# Patient Record
Sex: Female | Born: 1995 | Race: White | Hispanic: No | Marital: Single | Smoking: Never smoker
Health system: Southern US, Community
[De-identification: ages and names within clinical notes are randomized; demographics above are authoritative.]

## PROBLEM LIST (undated history)

## (undated) ENCOUNTER — Ambulatory Visit (HOSPITAL_COMMUNITY): Discharge status: Absent without leave | Payer: Commercial Managed Care - PPO

## (undated) DIAGNOSIS — R569 Unspecified convulsions: Secondary | ICD-10-CM

## (undated) DIAGNOSIS — R0600 Dyspnea, unspecified: Secondary | ICD-10-CM

## (undated) DIAGNOSIS — F32A Depression, unspecified: Secondary | ICD-10-CM

## (undated) DIAGNOSIS — N200 Calculus of kidney: Secondary | ICD-10-CM

## (undated) DIAGNOSIS — F419 Anxiety disorder, unspecified: Secondary | ICD-10-CM

## (undated) DIAGNOSIS — Z87442 Personal history of urinary calculi: Secondary | ICD-10-CM

## (undated) DIAGNOSIS — B999 Unspecified infectious disease: Secondary | ICD-10-CM

## (undated) DIAGNOSIS — F429 Obsessive-compulsive disorder, unspecified: Secondary | ICD-10-CM

## (undated) HISTORY — DX: Calculus of kidney: N20.0

## (undated) HISTORY — PX: WISDOM TOOTH EXTRACTION: SHX21

---
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
## 2014-11-26 DIAGNOSIS — F429 Obsessive-compulsive disorder, unspecified: Secondary | ICD-10-CM

## 2014-11-26 HISTORY — DX: Obsessive-compulsive disorder, unspecified: F42.9

## 2015-01-05 ENCOUNTER — Encounter: Payer: Self-pay | Admitting: Family Medicine

## 2015-01-05 ENCOUNTER — Ambulatory Visit (INDEPENDENT_AMBULATORY_CARE_PROVIDER_SITE_OTHER): Payer: Commercial Managed Care - PPO | Admitting: Family Medicine

## 2015-01-05 ENCOUNTER — Ambulatory Visit: Payer: Commercial Managed Care - PPO | Admitting: Internal Medicine

## 2015-01-05 VITALS — BP 98/60 | HR 71 | Temp 98.2°F | Wt 115.8 lb

## 2015-01-05 DIAGNOSIS — L989 Disorder of the skin and subcutaneous tissue, unspecified: Secondary | ICD-10-CM | POA: Insufficient documentation

## 2015-01-05 MED ORDER — DOXYCYCLINE HYCLATE 100 MG PO TABS: 100.0000 mg | ORAL_TABLET | Freq: Two times a day (BID) | ORAL | Status: DC

## 2015-01-05 NOTE — Patient Instructions (Signed)
Good to see you. Take doxycyline as directed- 1 tablet twice daily x 10 days. Apply warm compresses. Please call us with an update if it does not resolve as expected.

## 2015-01-05 NOTE — Assessment & Plan Note (Signed)
New- non fluctuant. Will treat with oral abx given duration and progression of symptoms. Doxycyline 100 mg twice daily x 10 days, warm compresses. Call or return to clinic prn if these symptoms worsen or fail to improve as anticipated. The patient indicates understanding of these issues and agrees with the plan.

## 2015-01-05 NOTE — Progress Notes (Signed)
Pre visit review using our clinic review tool, if applicable. No additional management support is needed unless otherwise documented below in the visit note. 

## 2015-01-05 NOTE — Progress Notes (Signed)
   Subjective:   Patient ID: Emily Lowe, female    DOB: [birth year], 19 y.o.   MRN: [id number]  Emily Lowe is a pleasant 19 y.o. year old female who presents to clinic today with Mass  on 01/05/2015  HPI:  Raised lesion under her left eye for over a week.  Becoming more red and firm. Not tender unless you touch it. No fevers or chills. Never had anything like this in past.  No known drug allergies.  No current outpatient prescriptions on file prior to visit.   No current facility-administered medications on file prior to visit.    No Known Allergies  Past Medical History  Diagnosis Date  . Kidney stones     followed by Dr. Achilles Dunkope    No past surgical history on file.  Family History  Problem Relation Age of Onset  . Hypertension Mother   . Gout Father     History   Social History  . Marital Status: Single    Spouse Name: N/A  . Number of Children: N/A  . Years of Education: N/A   Occupational History  . Not on file.   Social History Main Topics  . Smoking status: Never Smoker   . Smokeless tobacco: Not on file  . Alcohol Use: Not on file  . Drug Use: Not on file  . Sexual Activity: Not on file   Other Topics Concern  . Not on file   Social History Narrative   Graduated from high school in 2014.  Going to [employer] in fall.  Physics major.  Thinking about engineering.   The PMH, PSH, Social History, Family History, Medications, and allergies have been [facility], and have been updated if relevant.   Review of Systems  Constitutional: Negative.   Gastrointestinal: Negative.   All other systems reviewed and are negative.      Objective:    BP 98/60 mmHg  Pulse 71  Temp(Src) 98.2 F (36.8 C) (Oral)  Wt 115 lb 12 oz (52.504 kg)  SpO2 98%  LMP 12/31/2014   Physical Exam  Constitutional: She appears well-developed and well-nourished. No distress.  HENT:  Head: Normocephalic.  Eyes: Conjunctivae are normal.  Cardiovascular: Normal  rate.   Pulmonary/Chest: Effort normal.  Skin:     Psychiatric: She has a normal mood and affect. Her behavior is normal. Judgment and thought content normal.  Nursing note and vitals reviewed.         Assessment & Plan:   Skin lesion No Follow-up on file.

## 2015-01-07 ENCOUNTER — Ambulatory Visit: Payer: Commercial Managed Care - PPO | Admitting: Internal Medicine

## 2015-02-08 ENCOUNTER — Encounter: Payer: Self-pay | Admitting: Family Medicine

## 2015-02-08 ENCOUNTER — Ambulatory Visit (INDEPENDENT_AMBULATORY_CARE_PROVIDER_SITE_OTHER): Payer: Commercial Managed Care - PPO | Admitting: Family Medicine

## 2015-02-08 VITALS — BP 94/60 | HR 76 | Temp 98.7°F | Wt 116.2 lb

## 2015-02-08 DIAGNOSIS — L989 Disorder of the skin and subcutaneous tissue, unspecified: Secondary | ICD-10-CM

## 2015-02-08 DIAGNOSIS — F4322 Adjustment disorder with anxiety: Secondary | ICD-10-CM | POA: Insufficient documentation

## 2015-02-08 MED ORDER — SULFAMETHOXAZOLE-TRIMETHOPRIM 800-160 MG PO TABS: 1.0000 | ORAL_TABLET | Freq: Two times a day (BID) | ORAL | Status: DC

## 2015-02-08 NOTE — Assessment & Plan Note (Signed)
Deteriorated. Discussed tx options, including SSRIs- she wants to think about it and get back to me.

## 2015-02-08 NOTE — Progress Notes (Signed)
Pre visit review using our clinic review tool, if applicable. No additional management support is needed unless otherwise documented below in the visit note. 

## 2015-02-08 NOTE — Progress Notes (Signed)
   Subjective:   Patient ID: Emily Lowe, female    DOB: Mar 06, 1996, 19 y.o.   MRN: [id number]  Emily Lowe is a pleasant 19 y.o. year old female who presents to clinic today with Rash  on 02/08/2015  HPI:  Saw her on 2/10 for raised lesion under her left eye for over a week that was becoming progressively red and firm.   Never had anything like this in past.  ?infected cyst- given doxycyline 100 mg twice daily x 10 days, warm compresses and advised to follow up as needed. Resolved almost completely but recently it has returned in same spot.  No longer raised but about the same size, hyperpigmented. Non tender.  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 Denies feeling depressed.  No current outpatient prescriptions on file prior to visit.   No current facility-administered medications on file prior to visit.    No Known Allergies  Past Medical History  Diagnosis Date  . Kidney stones     followed by Dr. Achilles Dunkope    History reviewed. No pertinent past surgical history.  Family History  Problem Relation Age of Onset  . Hypertension Mother   . Gout Father     History   Social History  . Marital Status: Single    Spouse Name: N/A  . Number of Children: N/A  . Years of Education: N/A   Occupational History  . Not on file.   Social History Main Topics  . Smoking status: Never Smoker   . Smokeless tobacco: Not on file  . Alcohol Use: Not on file  . Drug Use: Not on file  . Sexual Activity: Not on file   Other Topics Concern  . Not on file   Social History Narrative   Graduated from high school in 2014.  Going to [employer] in fall.  Physics major.  Thinking about engineering.   The PMH, PSH, Social History, Family History, Medications, and allergies [facility], and have been updated if relevant.   Review of Systems  Constitutional: Negative.     Respiratory: Negative.   Cardiovascular: Negative.   Gastrointestinal: Negative.   Psychiatric/Behavioral: Negative for suicidal ideas, hallucinations, behavioral problems, confusion, sleep disturbance, self-injury, dysphoric mood and decreased concentration. The patient is nervous/anxious. The patient is not hyperactive.   All other systems reviewed and are negative.      Objective:    BP 94/60 mmHg  Pulse 76  Temp(Src) 98.7 F (37.1 C) (Oral)  Wt 116 lb 4 oz (52.731 kg)  SpO2 97%  LMP 02/03/2015   Physical Exam  Constitutional: She is oriented to person, place, and time. She appears well-developed and well-nourished. No distress.  HENT:  Head: Normocephalic.  Eyes: Conjunctivae are normal.  Cardiovascular: Normal rate.   Pulmonary/Chest: Effort normal.  Neurological: She is alert and oriented to person, place, and time.  Skin:     Psychiatric: She has a normal mood and affect. Her behavior is normal. Judgment and thought content normal.  Nursing note and vitals reviewed.         Assessment & Plan:   Skin lesion No Follow-up on file.

## 2015-02-08 NOTE — Patient Instructions (Signed)
Good to see you. Take Bactrim as directed- 1 tablet twice daily for 10 days.  We will call you with a dermatology referral.  Think about whether or not you want to try something like zoloft or lexapro to help with your anxiety.

## 2015-02-08 NOTE — Assessment & Plan Note (Signed)
Did improved with abx - ? Staph infection. Will treat with Bactrim DS- 1 tablet twice daily for 10 days. Also refer to derm for removal/biopsy- unclear if infectious or vascular/skin lesion. The patient indicates understanding of these issues and agrees with the plan.

## 2015-11-10 ENCOUNTER — Encounter: Payer: Self-pay | Admitting: Family Medicine

## 2015-11-10 ENCOUNTER — Ambulatory Visit (INDEPENDENT_AMBULATORY_CARE_PROVIDER_SITE_OTHER): Payer: Commercial Managed Care - PPO | Admitting: Family Medicine

## 2015-11-10 VITALS — BP 118/70 | HR 73 | Temp 97.9°F | Wt 112.5 lb

## 2015-11-10 DIAGNOSIS — F429 Obsessive-compulsive disorder, unspecified: Secondary | ICD-10-CM

## 2015-11-10 DIAGNOSIS — F4322 Adjustment disorder with anxiety: Secondary | ICD-10-CM

## 2015-11-10 MED ORDER — SERTRALINE HCL 25 MG PO TABS: 25.0000 mg | ORAL_TABLET | Freq: Every day | ORAL | Status: DC

## 2015-11-10 NOTE — Progress Notes (Signed)
   Subjective:   Patient ID: Emily Lowe, female    DOB: 07-27-1996, 19 y.o.   MRN: [id number]  Emily Lowe is a pleasant 19 y.o. year old female who presents to clinic today with Anxiety and obsessive compulsive disorder  on 11/10/2015  HPI:  She is concerned that she may have OCD.  Since she was in elementary school, she has had intermittent compulsive and obcessive behaviors, usually concerning hand washing. "I am a germa phobe."  Symptoms seemed to resolve on their own but have been worse recently- she just finished her fall semester at college.  Washes her hands so much they sometimes bleed.  Has never sought out treatment for this.  No current outpatient prescriptions on file prior to visit.   No current facility-administered medications on file prior to visit.    No Known Allergies  Past Medical History  Diagnosis Date  . Kidney stones     followed by Dr. Achilles Dunkope    No past surgical history on file.  Family History  Problem Relation Age of Onset  . Hypertension Mother   . Gout Father     Social History   Social History  . Marital Status: Single    Spouse Name: N/A  . Number of Children: N/A  . Years of Education: N/A   Occupational History  . Not on file.   Social History Main Topics  . Smoking status: Never Smoker   . Smokeless tobacco: Not on file  . Alcohol Use: Not on file  . Drug Use: Not on file  . Sexual Activity: Not on file   Other Topics Concern  . Not on file   Social History Narrative   Graduated from high school in 2014.  Going to [employer] in fall.  Physics major.  Thinking about engineering.   The PMH, PSH, Social History, Family History, Medications, and allergies have been [facility], and have been updated if relevant.   Review of Systems  Psychiatric/Behavioral: Negative for suicidal ideas, hallucinations, behavioral problems, confusion, sleep disturbance, self-injury, dysphoric mood, decreased concentration and  agitation. The patient is nervous/anxious. The patient is not hyperactive.   All other systems reviewed and are negative.      Objective:    BP 118/70 mmHg  Pulse 73  Temp(Src) 97.9 F (36.6 C) (Oral)  Wt 112 lb 8 oz (51.03 kg)  SpO2 99%  LMP 11/01/2015   Physical Exam  Constitutional: She is oriented to person, place, and time. She appears well-developed and well-nourished. No distress.  HENT:  Head: Normocephalic.  Eyes: Conjunctivae are normal.  Cardiovascular: Normal rate.   Pulmonary/Chest: Effort normal.  Musculoskeletal: Normal range of motion.  Neurological: She is alert and oriented to person, place, and time. No cranial nerve deficit.  Skin: Skin is warm and dry. She is not diaphoretic.  Psychiatric:  Tearful, good eye contact and appropriate  Nursing note and vitals reviewed.         Assessment & Plan:   OCD (obsessive compulsive disorder)  Adjustment disorder with anxiety No Follow-up on file.

## 2015-11-10 NOTE — Assessment & Plan Note (Signed)
>  25 minutes spent in face to face time with patient, >50% spent in counselling or coordination of care Discussed tx options- she declines psychotherapy but agrees to starting rx- eRx sent for zoloft 25 mg daily.  She will update me in 3-4 weeks.

## 2015-11-10 NOTE — Progress Notes (Signed)
Pre visit review using our clinic review tool, if applicable. No additional management support is needed unless otherwise documented below in the visit note. 

## 2015-11-10 NOTE — Patient Instructions (Signed)
Great to see you. We are starting zoloft 25 mg daily. Please call me with an update in a few weeks.  Happy Holidays.

## 2016-01-19 ENCOUNTER — Other Ambulatory Visit: Payer: Self-pay | Admitting: Family Medicine

## 2016-01-24 ENCOUNTER — Emergency Department (HOSPITAL_COMMUNITY)
Admission: EM | Admit: 2016-01-24 | Discharge: 2016-01-24 | Discharge status: Against Medical Advice | Payer: Commercial Managed Care - PPO | Attending: Emergency Medicine | Admitting: Emergency Medicine

## 2016-01-24 ENCOUNTER — Emergency Department (HOSPITAL_COMMUNITY): Payer: Commercial Managed Care - PPO

## 2016-01-24 ENCOUNTER — Encounter (HOSPITAL_COMMUNITY): Payer: Self-pay | Admitting: Emergency Medicine

## 2016-01-24 DIAGNOSIS — F429 Obsessive-compulsive disorder, unspecified: Secondary | ICD-10-CM | POA: Insufficient documentation

## 2016-01-24 DIAGNOSIS — Z87442 Personal history of urinary calculi: Secondary | ICD-10-CM | POA: Diagnosis not present

## 2016-01-24 DIAGNOSIS — F419 Anxiety disorder, unspecified: Secondary | ICD-10-CM | POA: Insufficient documentation

## 2016-01-24 DIAGNOSIS — R1032 Left lower quadrant pain: Secondary | ICD-10-CM | POA: Insufficient documentation

## 2016-01-24 DIAGNOSIS — Z79899 Other long term (current) drug therapy: Secondary | ICD-10-CM | POA: Insufficient documentation

## 2016-01-24 DIAGNOSIS — Z3202 Encounter for pregnancy test, result negative: Secondary | ICD-10-CM | POA: Diagnosis not present

## 2016-01-24 HISTORY — DX: Anxiety disorder, unspecified: F41.9

## 2016-01-24 HISTORY — DX: Obsessive-compulsive disorder, unspecified: F42.9

## 2016-01-24 LAB — COMPREHENSIVE METABOLIC PANEL
ALK PHOS: 38 U/L (ref 38–126)
ALT: 14 U/L (ref 14–54)
ANION GAP: 9 (ref 5–15)
AST: 19 U/L (ref 15–41)
Albumin: 4.5 g/dL (ref 3.5–5.0)
BILIRUBIN TOTAL: 0.7 mg/dL (ref 0.3–1.2)
BUN: 7 mg/dL (ref 6–20)
CALCIUM: 9.4 mg/dL (ref 8.9–10.3)
CO2: 24 mmol/L (ref 22–32)
Chloride: 108 mmol/L (ref 101–111)
Creatinine, Ser: 0.73 mg/dL (ref 0.44–1.00)
Glucose, Bld: 89 mg/dL (ref 65–99)
Potassium: 3.6 mmol/L (ref 3.5–5.1)
SODIUM: 141 mmol/L (ref 135–145)
TOTAL PROTEIN: 7.3 g/dL (ref 6.5–8.1)

## 2016-01-24 LAB — URINALYSIS, ROUTINE W REFLEX MICROSCOPIC
Bilirubin Urine: NEGATIVE
Glucose, UA: NEGATIVE mg/dL
Ketones, ur: 15 mg/dL — AB
NITRITE: NEGATIVE
PROTEIN: NEGATIVE mg/dL
Specific Gravity, Urine: 1.021 (ref 1.005–1.030)
pH: 5.5 (ref 5.0–8.0)

## 2016-01-24 LAB — CBC
HCT: 38.4 % (ref 36.0–46.0)
HEMOGLOBIN: 12.6 g/dL (ref 12.0–15.0)
MCH: 29.6 pg (ref 26.0–34.0)
MCHC: 32.8 g/dL (ref 30.0–36.0)
MCV: 90.1 fL (ref 78.0–100.0)
Platelets: 216 10*3/uL (ref 150–400)
RBC: 4.26 MIL/uL (ref 3.87–5.11)
RDW: 12.6 % (ref 11.5–15.5)
WBC: 11 10*3/uL — AB (ref 4.0–10.5)

## 2016-01-24 LAB — PREGNANCY, URINE: Preg Test, Ur: NEGATIVE

## 2016-01-24 LAB — I-STAT TROPONIN, ED: TROPONIN I, POC: 0 ng/mL (ref 0.00–0.08)

## 2016-01-24 LAB — URINE MICROSCOPIC-ADD ON

## 2016-01-24 LAB — LIPASE, BLOOD: Lipase: 21 U/L (ref 11–51)

## 2016-01-24 NOTE — ED Provider Notes (Signed)
CSN: [id number]     Arrival date & time 01/24/16  1430 History   First MD Initiated Contact with Patient 01/24/16 1810     Chief Complaint  Patient presents with  . Abdominal Pain  . Flank Pain     (Consider location/radiation/quality/duration/timing/severity/associated sxs/prior Treatment) HPI Comments: Patient presents to the ED with a chief complaint of LLQ abdominal pain. States that the pain started suddenly at 2:00PM today.  She states that she has a history of KS, but is not convinced that this feels like one.  She states that she also just started her period.  She denies any associate dysuria.  Denies any vaginal discharge.  There are no modifying factors.  Has never required intervention for KS.  Pain significantly improved after arriving at ED.  The history is provided by the patient. No language interpreter was used.    Past Medical History  Diagnosis Date  . Kidney stones     followed by Dr. Achilles Dunk  . OCD (obsessive compulsive disorder)   . Anxiety    History reviewed. No pertinent past surgical history. Family History  Problem Relation Age of Onset  . Hypertension Mother   . Gout Father    Social History  Substance Use Topics  . Smoking status: Never Smoker   . Smokeless tobacco: None  . Alcohol Use: None   OB History    No data available     Review of Systems  All other systems reviewed and are negative.     Allergies  Review of patient's allergies indicates no known allergies.  Home Medications   Prior to Admission medications   Medication Sig Start Date End Date Taking? Authorizing Provider  ibuprofen (ADVIL,MOTRIN) 200 MG tablet Take 200 mg by mouth every 6 (six) hours as needed for headache.   Yes Historical Provider, MD  oxyCODONE-acetaminophen (PERCOCET/ROXICET) 5-325 MG tablet Take 1 tablet by mouth every 6 (six) hours as needed for severe pain.   Yes Historical Provider, MD  sertraline (ZOLOFT) 25 MG tablet take 1 tablet by mouth once daily  01/19/16  Yes Dianne Dun, MD   BP 116/80 mmHg  Pulse 97  Temp(Src) 97.9 F (36.6 C) (Oral)  Resp 16  SpO2 100% Physical Exam  Constitutional: She is oriented to person, place, and time. She appears well-developed and well-nourished.  HENT:  Head: Normocephalic and atraumatic.  Eyes: Conjunctivae and EOM are normal. Pupils are equal, round, and reactive to light.  Neck: Normal range of motion. Neck supple.  Cardiovascular: Normal rate and regular rhythm.  Exam reveals no gallop and no friction rub.   No murmur heard. Pulmonary/Chest: Effort normal and breath sounds normal. No respiratory distress. She has no wheezes. She has no rales. She exhibits no tenderness.  Abdominal: Soft. Bowel sounds are normal. She exhibits no distension and no mass. There is no tenderness. There is no rebound and no guarding.  No focal abdominal tenderness, no RLQ tenderness or pain at McBurney's point, no RUQ tenderness or Murphy's sign, no left-sided abdominal tenderness, no fluid wave, or signs of peritonitis  No CVA tenderness  Musculoskeletal: Normal range of motion. She exhibits no edema or tenderness.  Neurological: She is alert and oriented to person, place, and time.  Skin: Skin is warm and dry.  Psychiatric: She has a normal mood and affect. Her behavior is normal. Judgment and thought content normal.  Nursing note and vitals reviewed.   ED Course  Procedures (including critical care time) Results for  orders placed or performed during the hospital encounter of 01/24/16  Lipase, blood  Result Value Ref Range   Lipase 21 11 - 51 U/L  Comprehensive metabolic panel  Result Value Ref Range   Sodium 141 135 - 145 mmol/L   Potassium 3.6 3.5 - 5.1 mmol/L   Chloride 108 101 - 111 mmol/L   CO2 24 22 - 32 mmol/L   Glucose, Bld 89 65 - 99 mg/dL   BUN 7 6 - 20 mg/dL   Creatinine, Ser 1.61 0.44 - 1.00 mg/dL   Calcium 9.4 8.9 - 09.6 mg/dL   Total Protein 7.3 6.5 - 8.1 g/dL   Albumin 4.5 3.5 - 5.0  g/dL   AST 19 15 - 41 U/L   ALT 14 14 - 54 U/L   Alkaline Phosphatase 38 38 - 126 U/L   Total Bilirubin 0.7 0.3 - 1.2 mg/dL   GFR calc non Af Amer >60 >60 mL/min   GFR calc Af Amer >60 >60 mL/min   Anion gap 9 5 - 15  CBC  Result Value Ref Range   WBC 11.0 (H) 4.0 - 10.5 K/uL   RBC 4.26 3.87 - 5.11 MIL/uL   Hemoglobin 12.6 12.0 - 15.0 g/dL   HCT 04.5 40.9 - 81.1 %   MCV 90.1 78.0 - 100.0 fL   MCH 29.6 26.0 - 34.0 pg   MCHC 32.8 30.0 - 36.0 g/dL   RDW 91.4 78.2 - 95.6 %   Platelets 216 150 - 400 K/uL  Urinalysis, Routine w reflex microscopic (not at [facility])  Result Value Ref Range   Color, Urine YELLOW YELLOW   APPearance CLOUDY (A) CLEAR   Specific Gravity, Urine 1.021 1.005 - 1.030   pH 5.5 5.0 - 8.0   Glucose, UA NEGATIVE NEGATIVE mg/dL   Hgb urine dipstick MODERATE (A) NEGATIVE   Bilirubin Urine NEGATIVE NEGATIVE   Ketones, ur 15 (A) NEGATIVE mg/dL   Protein, ur NEGATIVE NEGATIVE mg/dL   Nitrite NEGATIVE NEGATIVE   Leukocytes, UA TRACE (A) NEGATIVE  Pregnancy, urine  Result Value Ref Range   Preg Test, Ur NEGATIVE NEGATIVE  Urine microscopic-add on  Result Value Ref Range   Squamous Epithelial / LPF 6-30 (A) NONE SEEN   WBC, UA 6-30 0 - 5 WBC/hpf   RBC / HPF 6-30 0 - 5 RBC/hpf   Bacteria, UA RARE (A) NONE SEEN   Urine-Other MUCOUS PRESENT   I-stat troponin, ED  Result Value Ref Range   Troponin i, poc 0.00 0.00 - 0.08 ng/mL   Comment 3           Ct Renal Stone Study  01/24/2016  CLINICAL DATA:  Severe left lower abdominal pain, history of nephrolithiasis EXAM: CT ABDOMEN AND PELVIS WITHOUT CONTRAST TECHNIQUE: Multidetector CT imaging of the abdomen and pelvis was performed following the standard protocol without IV contrast. COMPARISON:  None available FINDINGS: Lower chest:  No acute findings. Hepatobiliary: No mass visualized on this un-enhanced exam. Pancreas: No mass or inflammatory process identified on this un-enhanced exam. Spleen: Within normal limits in size.  Adrenals/Urinary Tract: Normal adrenal glands. Kidneys demonstrate scattered punctate tiny intrarenal calculi bilaterally measuring 5 mm or less in size. Both kidneys demonstrate hyper attenuation of the medullary pyramids suspicious for minor medullary nephrocalcinosis. No acute obstructing urinary tract or ureteral calculus. No hydronephrosis, obstructive uropathy or perinephric inflammatory process on either side. Urinary bladder collapsed. Stomach/Bowel: Negative bowel obstruction, significant dilatation, ileus, or free air. Normal appendix in the  right lower quadrant. Vascular/Lymphatic: No pathologically enlarged lymph nodes. No evidence of abdominal aortic aneurysm. Reproductive: Uterus and adnexal normal in size. Small amount of pelvic free fluid. Other: No inguinal abnormality or hernia.  Intact abdominal wall. Musculoskeletal:  No acute osseous finding. IMPRESSION: No acute hydronephrosis, obstructive uropathy, or obstructing ureteral calculus on either side. Punctate nonobstructing intrarenal calculi bilaterally all measuring 5 mm or less in size. Hyper attenuation of the medullary pyramids bilaterally compatible with minor medullary nephrocalcinosis. Normal appendix No acute intra-abdominal or pelvic finding by noncontrast CT. Electronically Signed   By: Judie Petit.  Shick M.D.   On: 01/24/2016 19:58    I have personally reviewed and evaluated these images and lab results as part of my medical decision-making.   EKG Interpretation None      MDM   Final diagnoses:  Left lower quadrant pain    Patient with left-sided abdominal pain. Has a history of kidney stones. States that this feels similar. She is also starting her menstrual cycle.mild leukocytosis to 11, normal electrolytes and lipase.urine pregnancy test negative. No evidence of UTI.  Advised the patient have a pelvic exam and consider ultrasound for ovarian etiology. Patient declines, numbness or CT scan to rule out kidney stone.  CT  scan is negative for acute process. Once again, recommended pelvic exam.  Encouraged patient that female provider could do the pelvic.  Patient refused.   Patients that they would like to leave.  I have discussed my concerns with the patient about them leaving without completing the evaluation.    Patient presents with LLQ pain  Symptoms include: pain  Concern for: ovarian etiology of pain, worst case torsion  Study limitations and other tests offered include: Declined pelvic exam  Treatment and recommended follow-up include: Recommended pelvic exam and pelvic US.  I do not feel that the patient should leave prior to completing their workup. I have discussed the above symptoms, initial findings, study limitations, and treatment plan with the patient. Patient is not altered, does not have any distracting issues, and has capacity to make decisions for themselves. Patient places themselves at risk of torsion, worsening symptoms, disability, morbidity and/or death.    Roxy Horseman, PA-C 01/24/16 0981  Arby Barrette, MD 01/25/16 Ernestina Columbia

## 2016-01-24 NOTE — ED Notes (Addendum)
Per EMS, was walking and began having severe left lower abdominal pain. Hx of kidney stones, passed one last week and was given percocet to take, couldn't get to her dorm room to take it d/t pain. Called EMS because she couldn't walk d/t pain. Also on her menstrual cycle.  Pt states she's unsure if it's a kidney stone or if she's having menstrual cramps. Bilateral lower quadrant abdominal pain, denies blood in urine.

## 2016-01-24 NOTE — ED Notes (Signed)
Patient transported to CT 

## 2016-01-24 NOTE — Discharge Instructions (Signed)

## 2016-01-24 NOTE — ED Notes (Signed)
PA at bedside.

## 2016-01-24 NOTE — ED Notes (Signed)
Discharge instructions and follow up care reviewed with patient. Patient verbalized understanding. Patient also states that she understands that she is leaving AMA and the risks associated with that.

## 2016-01-27 ENCOUNTER — Ambulatory Visit (INDEPENDENT_AMBULATORY_CARE_PROVIDER_SITE_OTHER): Payer: Commercial Managed Care - PPO | Admitting: Internal Medicine

## 2016-01-27 ENCOUNTER — Encounter: Payer: Self-pay | Admitting: Internal Medicine

## 2016-01-27 VITALS — BP 106/60 | HR 64 | Temp 98.6°F | Wt 106.0 lb

## 2016-01-27 DIAGNOSIS — N946 Dysmenorrhea, unspecified: Secondary | ICD-10-CM

## 2016-01-27 DIAGNOSIS — R102 Pelvic and perineal pain: Secondary | ICD-10-CM | POA: Diagnosis not present

## 2016-01-27 NOTE — Progress Notes (Signed)
Pre visit review using our clinic review tool, if applicable. No additional management support is needed unless otherwise documented below in the visit note. 

## 2016-01-27 NOTE — Patient Instructions (Signed)

## 2016-01-27 NOTE — Progress Notes (Signed)
Subjective:    Patient ID: Emily HolsterAlyssa K Lowe, female    DOB: August 08, 1996, 19 y.o.   MRN: [id number]  HPI  Pt presents to the clinic today for ER followup. She called EMS 3 days ago for severe pelvic pain, that was so bad she was unable to walk. At the ER, they did a CT renal stone study, which was negative. She does have a history of kidney stones. They did a urine HCG to r/o etopic pregnancy, which was negative. The wanted to rule out ovarian torsion but she refused pelvic exam/ultrasound. She ended up leaving the ER AMA. She reports the pain has since resolved. She did start her period the same day she went to the ER, so she assumes that the pain was severe menstrual cramps. She does have regular periods, with light bleeding. Sometimes the cramps are bad and other times they are not. She reports she is not sexually active.  Review of Systems      Past Medical History  Diagnosis Date  . Kidney stones     followed by Dr. Achilles Dunkope  . OCD (obsessive compulsive disorder)   . Anxiety     Current Outpatient Prescriptions  Medication Sig Dispense Refill  . ibuprofen (ADVIL,MOTRIN) 200 MG tablet Take 200 mg by mouth every 6 (six) hours as needed for headache.    . oxyCODONE-acetaminophen (PERCOCET/ROXICET) 5-325 MG tablet Take 1 tablet by mouth every 6 (six) hours as needed for severe pain.    Marland Kitchen. sertraline (ZOLOFT) 25 MG tablet take 1 tablet by mouth once daily 30 tablet 5   No current facility-administered medications for this visit.    No Known Allergies  Family History  Problem Relation Age of Onset  . Hypertension Mother   . Gout Father     Social History   Social History  . Marital Status: Single    Spouse Name: N/A  . Number of Children: N/A  . Years of Education: N/A   Occupational History  . Not on file.   Social History Main Topics  . Smoking status: Never Smoker   . Smokeless tobacco: Not on file  . Alcohol Use: Not on file  . Drug Use: Not on file  . Sexual  Activity: Not on file   Other Topics Concern  . Not on file   Social History Narrative   Graduated from high school in 2014.  Going to [employer] in fall.  Physics major.  Thinking about engineering.     Constitutional: Denies fever, malaise, fatigue, headache or abrupt weight changes.  Respiratory: Denies difficulty breathing, shortness of breath, cough or sputum production.   Cardiovascular: Denies chest pain, chest tightness, palpitations or swelling in the hands or feet.  Gastrointestinal: Denies abdominal pain, bloating, constipation, diarrhea or blood in the stool.  GU: Pt reports vaginal bleeding. Denies urgency, frequency, pain with urination, burning sensation, blood in urine, odor or discharge.  No other specific complaints in a complete review of systems (except as listed in HPI above).  Objective:   Physical Exam  BP 106/60 mmHg  Pulse 64  Temp(Src) 98.6 F (37 C) (Oral)  Wt 106 lb (48.081 kg)  SpO2 99%  LMP 01/24/2016 Wt Readings from Last 3 Encounters:  01/27/16 106 lb (48.081 kg)  11/10/15 112 lb 8 oz (51.03 kg)  02/08/15 116 lb 4 oz (52.731 kg) (26 %*, Z = -0.64)   * Growth percentiles are based on CDC 2-20 Years data.    General: Appears  her stated age, well developed, well nourished in NAD. Cardiovascular: Normal rate and rhythm. S1,S2 noted.  No murmur, rubs or gallops noted.  Pulmonary/Chest: Normal effort and positive vesicular breath sounds. No respiratory distress. No wheezes, rales or ronchi noted.  Abdomen: Soft and nontender. Normal bowel sounds. Pelvic: She refuses.  BMET    Component Value Date/Time   NA 141 01/24/2016 1518   NA 139 04/14/2012 1830   K 3.6 01/24/2016 1518   K 3.6 04/14/2012 1830   CL 108 01/24/2016 1518   CL 104 04/14/2012 1830   CO2 24 01/24/2016 1518   CO2 27* 04/14/2012 1830   GLUCOSE 89 01/24/2016 1518   GLUCOSE 99 04/14/2012 1830   BUN 7 01/24/2016 1518   BUN 10 04/14/2012 1830   CREATININE 0.73 01/24/2016 1518    CREATININE 0.75 04/14/2012 1830   CALCIUM 9.4 01/24/2016 1518   CALCIUM 9.0 04/14/2012 1830   GFRNONAA >60 01/24/2016 1518   GFRAA >60 01/24/2016 1518    Lipid Panel     Component Value Date/Time   CHOL 171 06/01/2013 1447   TRIG 89.0 06/01/2013 1447   HDL 51.80 06/01/2013 1447   CHOLHDL 3 06/01/2013 1447   VLDL 17.8 06/01/2013 1447   LDLCALC 101* 06/01/2013 1447    CBC    Component Value Date/Time   WBC 11.0* 01/24/2016 1518   WBC 8.9 04/14/2012 1830   RBC 4.26 01/24/2016 1518   RBC 4.33 04/14/2012 1830   HGB 12.6 01/24/2016 1518   HGB 12.4 04/14/2012 1830   HCT 38.4 01/24/2016 1518   HCT 38.1 04/14/2012 1830   PLT 216 01/24/2016 1518   PLT 257 04/14/2012 1830   MCV 90.1 01/24/2016 1518   MCV 88 04/14/2012 1830   MCH 29.6 01/24/2016 1518   MCH 28.6 04/14/2012 1830   MCHC 32.8 01/24/2016 1518   MCHC 32.4 04/14/2012 1830   RDW 12.6 01/24/2016 1518   RDW 13.2 04/14/2012 1830    Hgb A1C No results found for: HGBA1C       Assessment & Plan:   ER follow up for severe pelvic pain:  ER notes, labs and imaging reviewed Her pain has resolved Discussed treating with OCP's to reduce the intensity of menstrual cramps, but she wants to think about this for now Could have been a kidney stone that had passed, advised her to drink lots of fluids She is not interested in seeing a urologist at this time Advised her that if pain returns, she should consider having a pelvic exam, Pap Smear, and ultrasound  RTC as needed or if symptoms persist or worsen

## 2016-08-04 ENCOUNTER — Other Ambulatory Visit: Payer: Self-pay | Admitting: Family Medicine

## 2016-09-03 ENCOUNTER — Encounter: Payer: Self-pay | Admitting: Family Medicine

## 2016-09-03 ENCOUNTER — Ambulatory Visit (INDEPENDENT_AMBULATORY_CARE_PROVIDER_SITE_OTHER): Payer: Commercial Managed Care - PPO | Admitting: Family Medicine

## 2016-09-03 VITALS — BP 102/70 | HR 68 | Temp 97.9°F | Wt 103.8 lb

## 2016-09-03 DIAGNOSIS — F429 Obsessive-compulsive disorder, unspecified: Secondary | ICD-10-CM | POA: Diagnosis not present

## 2016-09-03 DIAGNOSIS — N926 Irregular menstruation, unspecified: Secondary | ICD-10-CM | POA: Insufficient documentation

## 2016-09-03 DIAGNOSIS — F4322 Adjustment disorder with anxiety: Secondary | ICD-10-CM | POA: Diagnosis not present

## 2016-09-03 DIAGNOSIS — Z309 Encounter for contraceptive management, unspecified: Secondary | ICD-10-CM | POA: Insufficient documentation

## 2016-09-03 MED ORDER — NORETHIN ACE-ETH ESTRAD-FE 1-20 MG-MCG PO TABS: 1.0000 | ORAL_TABLET | Freq: Every day | ORAL | 11 refills | Status: DC

## 2016-09-03 MED ORDER — SERTRALINE HCL 50 MG PO TABS: 50.0000 mg | ORAL_TABLET | Freq: Every day | ORAL | 3 refills | Status: DC

## 2016-09-03 NOTE — Progress Notes (Signed)
Subjective:   Patient ID: Emily Lowe, female    DOB: 1996/05/05, 20 y.o.   MRN: [id number]  Emily Lowe is a pleasant 20 y.o. year old female who presents to clinic today with Follow-up; Dysmenorrhea; and Contraception  on 09/03/2016  HPI:  Anxiety- feels symptoms initially improved with zoloft 25 mg daily.  Her roomates, family and friends noticed she had been less anxious. Lately, she feels with increased stressors at school, she has been more anxious and symptoms of OCD have returned again.  Denies feeling depressed.  Sleeping ok.  No SI or HI.  Menstrual cramping- feels periods are not irregular and not heavier but cramping is worse over the past year.  Not sexually active.  Has never taken OCPs but would like to talk about her options today.  Current Outpatient Prescriptions on File Prior to Visit  Medication Sig Dispense Refill  . ibuprofen (ADVIL,MOTRIN) 200 MG tablet Take 200 mg by mouth every 6 (six) hours as needed for headache.    . oxyCODONE-acetaminophen (PERCOCET/ROXICET) 5-325 MG tablet Take 1 tablet by mouth every 6 (six) hours as needed for severe pain.     No current facility-administered medications on file prior to visit.     No Known Allergies  Past Medical History:  Diagnosis Date  . Anxiety   . Kidney stones    followed by Dr. Achilles Dunk  . OCD (obsessive compulsive disorder)     No past surgical history on file.  Family History  Problem Relation Age of Onset  . Hypertension Mother   . Gout Father     Social History   Social History  . Marital status: Single    Spouse name: N/A  . Number of children: N/A  . Years of education: N/A   Occupational History  . Not on file.   Social History Main Topics  . Smoking status: Never Smoker  . Smokeless tobacco: Not on file  . Alcohol use Not on file  . Drug use: Unknown  . Sexual activity: Not on file   Other Topics Concern  . Not on file   Social History Narrative   Graduated from high  school in 2014.  Going to [employer] in fall.  Physics major.  Thinking about engineering.   The PMH, PSH, Social History, Family History, Medications, and allergies have been reviewed in [facility], and have been updated if relevant.   Review of Systems  Constitutional: Negative.   HENT: Negative.   Respiratory: Negative.   Cardiovascular: Negative.   Gastrointestinal: Negative.   Genitourinary: Positive for menstrual problem.  Allergic/Immunologic: Negative.   Neurological: Negative.   Psychiatric/Behavioral: Negative for behavioral problems, confusion, decreased concentration, dysphoric mood, hallucinations, self-injury, sleep disturbance and suicidal ideas. The patient is nervous/anxious. The patient is not hyperactive.   All other systems reviewed and are negative.      Objective:    BP 102/70   Pulse 68   Temp 97.9 F (36.6 C) (Oral)   Wt 103 lb 12 oz (47.1 kg)   LMP 08/10/2016   SpO2 99%   BMI 20.43 kg/m    Physical Exam  Constitutional: She is oriented to person, place, and time. She appears well-developed and well-nourished. No distress.  HENT:  Head: Normocephalic.  Eyes: Conjunctivae are normal.  Cardiovascular: Normal rate.   Pulmonary/Chest: Effort normal.  Neurological: She is alert and oriented to person, place, and time. No cranial nerve deficit.  Skin: Skin is warm and dry. She is  not diaphoretic.  Psychiatric: She has a normal mood and affect. Her behavior is normal. Judgment and thought content normal.  Nursing note and vitals reviewed.         Assessment & Plan:   Adjustment disorder with anxiety  Encounter for contraceptive management, unspecified type  Irregular periods No Follow-up on file.

## 2016-09-03 NOTE — Patient Instructions (Signed)
Great to see you. We are increasing your zoloft to 50 mg daily. Keep  Me updated.

## 2016-09-03 NOTE — Assessment & Plan Note (Signed)
Deteriorated. See above. Increase zoloft to 50 mg daily. She will update me in a few weeks.

## 2016-09-03 NOTE — Assessment & Plan Note (Signed)
Deteriorated. Increase zoloft to 50 mg daily. She will update me in a few weeks.

## 2016-09-03 NOTE — Assessment & Plan Note (Signed)
Discussed hormonal contraception for indication of cramping. She will like to try OCPs- eRx sent for loestrin. She will update me in a few months.

## 2016-09-03 NOTE — Progress Notes (Signed)
Pre visit review using our clinic review tool, if applicable. No additional management support is needed unless otherwise documented below in the visit note. 

## 2016-12-24 ENCOUNTER — Telehealth: Payer: Self-pay

## 2016-12-24 NOTE — Telephone Encounter (Signed)
PLEASE NOTE: All timestamps contained within this report are represented as [place] Standard Time. CONFIDENTIALTY NOTICE: This fax transmission is intended only for the addressee. It contains information that is legally privileged, confidential or otherwise protected from use or disclosure. If you are not the intended recipient, you are strictly prohibited from reviewing, disclosing, copying using or disseminating any of this information or taking any action in reliance on or regarding this information. If you have received this fax in error, please notify us immediately by telephone so that we can arrange for its return to [place]. Phone: [phone], Toll-Free: [phone], Fax: [phone] Page: 1 of 2 Call Id: [id number] [facility] Night - Client TELEPHONE ADVICE RECORD [facility] Patient Name: Emily Lowe Gender: Female DOB: Aug 19, 1996 Age: 21 Y 10 M 5 D Return Phone Number: [phone] (Primary), [phone] (Secondary) City/State/Zip: [place] Client [facility] Night - Client Client Site [facility] - Night Physician Ruthe Mannan - MD Who Is Calling Patient / Member / Family / Caregiver Call Type Triage / Clinical Relationship To Patient Self Return Phone Number [phone] (Secondary) Chief Complaint Headache Reason for Call Symptomatic / Request for Health Information Initial Comment Caller has a really bad sore throat, stuffy nose, and headache. Caller's mom is nurse and said it looks like strep. Needs something called in. Nurse Assessment Nurse: Jetty Peeks, RN, Lillia Abed Date/Time (Eastern Time): 12/22/2016 9:14:48 PM Confirm and document reason for call. If symptomatic, describe symptoms. ---Caller states she has a sore throat, stuffy nose and a headache. Caller states her throat started hurting a few days ago and her head started hurting yesterday, and stuffy nose started yesterday. Caller  states she was nauseated this morning around 3am. Caller states her mom looked at her throat and saw white patches in her throat. Does the PT have any chronic conditions? (i.e. diabetes, asthma, etc.) ---No Is the patient pregnant or possibly pregnant? (Ask all females between the ages of 76-55) ---No Guidelines Guideline Title Affirmed Question Sore Throat Earache also present Disp. Time Lamount Cohen Time) Disposition Final User 12/22/2016 9:33:29 PM See Physician within 24 Hours Yes Weiss-Hilton, RN, Lillia Abed Referrals REFERRED TO PCP OFFICE Care Advice Given Per Guideline SEE PHYSICIAN WITHIN 24 HOURS: * IF OFFICE WILL BE CLOSED AND NO PCP TRIAGE: You need to be seen within the next 24 hours. An urgent care center is often a good source of care if your doctor's office is closed. SORE THROAT - For relief of sore throat: * Sip warm chicken broth or apple juice. * Suck on hard candy or a throat lozenge (OTC). * Gargle with warm salt water four times a day. To make salt water, put 1/2 teaspoon of salt in 8 oz (240 ml) of warm water. * Avoid cigarette smoke. PAIN OR FEVER MEDICINES: * For pain and fever relief, take acetaminophen or ibuprofen. * Treat fevers above 101 F (38.3 C). * The goal of fever therapy is to bring the fever down to a comfortable level. Remember that fever medicine usually lowers fever 2-3 F (1-1.5 C). ACETAMINOPHEN (E.G., TYLENOL): IBUPROFEN (E.G., MOTRIN, ADVIL): NAPROXEN (E.G., ALEVE): CAUTION - NSAIDS (E.G., IBUPROFEN, NAPROXEN): * Do not take nonsteroidal anti-inflammatory drugs (NSAIDs) if you have stomach problems, kidney disease, heart failure, or other contraindications to using this type of medication. * Do not take PLEASE NOTE: All timestamps contained within this report are represented as [place] Standard Time. CONFIDENTIALTY NOTICE: This fax transmission is intended only for  the addressee. It contains information that is legally privileged, confidential or  otherwise protected from use or disclosure. If you are not the intended recipient, you are strictly prohibited from reviewing, disclosing, copying using or disseminating any of this information or taking any action in reliance on or regarding this information. If you have received this fax in error, please notify us immediately by telephone so that we can arrange for its return to us. Phone: [phone], Toll-Free: [phone], Fax: [phone] Page: 2 of 2 Call Id: [id number] Care Advice Given Per Guideline NSAID medications for over 7 days without consulting your PCP. * You may take this medicine with or without food. Taking it with food or milk may lessen the chance the drug will upset your stomach. * GASTROINTESTINAL RISK: There is an increased risk of stomach ulcers, GI bleeding, perforation. * CARDIOVASCULAR RISK: There may be an increased risk of heart attack and stroke. SOFT DIET: * Eat a soft diet. * Cold drinks, popsicles, and milk shakes are especially good. Avoid citrus fruits. * You become worse. CALL BACK IF: CARE ADVICE given per Sore Throat (Adult) guideline. Comments User: Jovita KussmaulLindsay, Weiss-Hilton, RN Date/Time Lamount Cohen(Eastern Time): 12/22/2016 9:28:04 PM caller states she does not think she has a fever. User: Jovita KussmaulLindsay, Weiss-Hilton, RN Date/Time Lamount Cohen(Eastern Time): 12/22/2016 9:30:02 PM caller states her ear was hurting very badly "awhile ago" but she took naproxen and states that now it doesn't hurt so bad, User: Jovita KussmaulLindsay, Weiss-Hilton, RN Date/Time Lamount Cohen(Eastern Time): 12/22/2016 9:33:58 PM Caller declined [facility] info email.

## 2017-01-09 ENCOUNTER — Other Ambulatory Visit: Payer: Self-pay | Admitting: Family Medicine

## 2017-08-03 ENCOUNTER — Other Ambulatory Visit: Payer: Self-pay | Admitting: Family Medicine

## 2017-08-10 ENCOUNTER — Other Ambulatory Visit: Payer: Self-pay | Admitting: Family Medicine

## 2017-08-22 ENCOUNTER — Ambulatory Visit (INDEPENDENT_AMBULATORY_CARE_PROVIDER_SITE_OTHER): Payer: Commercial Managed Care - PPO | Admitting: Family Medicine

## 2017-08-22 VITALS — BP 102/62 | HR 71 | Ht 60.0 in | Wt 112.8 lb

## 2017-08-22 DIAGNOSIS — Z0000 Encounter for general adult medical examination without abnormal findings: Secondary | ICD-10-CM | POA: Insufficient documentation

## 2017-08-22 DIAGNOSIS — F429 Obsessive-compulsive disorder, unspecified: Secondary | ICD-10-CM

## 2017-08-22 DIAGNOSIS — Z309 Encounter for contraceptive management, unspecified: Secondary | ICD-10-CM | POA: Diagnosis not present

## 2017-08-22 MED ORDER — SERTRALINE HCL 50 MG PO TABS: 50.0000 mg | ORAL_TABLET | Freq: Every day | ORAL | 11 refills | Status: DC

## 2017-08-22 MED ORDER — NORETHIN ACE-ETH ESTRAD-FE 1-20 MG-MCG PO TABS: 1.0000 | ORAL_TABLET | Freq: Every day | ORAL | 11 refills | Status: DC

## 2017-08-22 NOTE — Progress Notes (Signed)
Subjective:   Patient ID: Emily Lowe, female    DOB: Nov 09, 1996, 21 y.o.   MRN: [id number]  Emily Lowe is a pleasant 21 y.o. year old female who presents to clinic today with Follow-up  on 08/22/2017  HPI:  Periods are light now, no cramping with Junel.   Anxiety- feels symptoms initially improved with zoloft 50 mg daily.   Denies feeling depressed.  Sleeping ok.  No SI or HI.  Lab Results  Component Value Date   CHOL 171 06/01/2013   HDL 51.80 06/01/2013   LDLCALC 101 (H) 06/01/2013   TRIG 89.0 06/01/2013   CHOLHDL 3 06/01/2013   Lab Results  Component Value Date   CREATININE 0.73 01/24/2016   Lab Results  Component Value Date   WBC 11.0 (H) 01/24/2016   HGB 12.6 01/24/2016   HCT 38.4 01/24/2016   MCV 90.1 01/24/2016   PLT 216 01/24/2016   No results found for: TSH   Current Outpatient Prescriptions on File Prior to Visit  Medication Sig Dispense Refill  . ibuprofen (ADVIL,MOTRIN) 200 MG tablet Take 200 mg by mouth every 6 (six) hours as needed for headache.    . oxyCODONE-acetaminophen (PERCOCET/ROXICET) 5-325 MG tablet Take 1 tablet by mouth every 6 (six) hours as needed for severe pain.     No current facility-administered medications on file prior to visit.     No Known Allergies  Past Medical History:  Diagnosis Date  . Anxiety   . Kidney stones    followed by Dr. Achilles Dunk  . OCD (obsessive compulsive disorder)     No past surgical history on file.  Family History  Problem Relation Age of Onset  . Hypertension Mother   . Gout Father     Social History   Social History  . Marital status: Single    Spouse name: N/A  . Number of children: N/A  . Years of education: N/A   Occupational History  . Not on file.   Social History Main Topics  . Smoking status: Never Smoker  . Smokeless tobacco: Not on file  . Alcohol use Not on file  . Drug use: Unknown  . Sexual activity: Not on file   Other Topics Concern  . Not on file    Social History Narrative   Graduated from high school in 2014.  Going to [employer] in fall.  Physics major.  Thinking about engineering.   The PMH, PSH, Social History, Family History, Medications, and allergies have been reviewed in [facility], and have been updated if relevant.  Review of Systems  Constitutional: Negative.   Genitourinary: Negative.   Musculoskeletal: Negative.   Hematological: Negative.   Psychiatric/Behavioral: Negative.   All other systems reviewed and are negative.      Objective:    BP 102/62   Pulse 71   Ht 5' (1.524 m)   Wt 112 lb 12.8 oz (51.2 kg)   LMP 08/15/2017   SpO2 98%   BMI 22.03 kg/m    Physical Exam    General:  Well-developed,well-nourished,in no acute distress; alert,appropriate and cooperative throughout examination Head:  normocephalic and atraumatic.   Eyes:  vision grossly intact, PERRL Ears:  R ear normal and L ear normal externally, TMs clear bilaterally Nose:  no external deformity.   Mouth:  good dentition.   Neck:  No deformities, masses, or tenderness noted. Lungs:  Normal respiratory effort, chest expands symmetrically. Lungs are clear to auscultation, no crackles or wheezes. Heart:  Normal rate and regular rhythm. S1 and S2 normal without gallop, murmur, click, rub or other extra sounds. Msk:  No deformity or scoliosis noted of thoracic or lumbar spine.   Extremities:  No clubbing, cyanosis, edema, or deformity noted with normal full range of motion of all joints.   Neurologic:  alert & oriented X3 and gait normal.   Skin:  Intact without suspicious lesions or rashes Psych:  Cognition and judgment appear intact. Alert and cooperative with normal attention span and concentration. No apparent delusions, illusions, hallucinations      Assessment & Plan:   Encounter for contraceptive management, unspecified type  Obsessive-compulsive disorder, unspecified type No Follow-up on file.

## 2017-08-22 NOTE — Assessment & Plan Note (Signed)
Improved on current dose of zoloft. No changes made.

## 2017-08-22 NOTE — Patient Instructions (Signed)
Great to see you.  Please make an appointment for a physical/pap smear and blood work.

## 2017-08-22 NOTE — Assessment & Plan Note (Signed)
Menorrhagia resolved. No changes made to OCP rxs today.

## 2017-09-24 ENCOUNTER — Ambulatory Visit (INDEPENDENT_AMBULATORY_CARE_PROVIDER_SITE_OTHER): Payer: Commercial Managed Care - PPO | Admitting: Family Medicine

## 2017-09-24 ENCOUNTER — Other Ambulatory Visit (HOSPITAL_COMMUNITY)
Admission: RE | Admit: 2017-09-24 | Discharge: 2017-09-24 | Disposition: A | Discharge status: Home / Self Care | Payer: Commercial Managed Care - PPO | Source: Ambulatory Visit | Attending: Family Medicine | Admitting: Family Medicine

## 2017-09-24 ENCOUNTER — Encounter: Payer: Self-pay | Admitting: Family Medicine

## 2017-09-24 VITALS — BP 106/74 | HR 67 | Temp 97.8°F | Ht 60.0 in | Wt 113.8 lb

## 2017-09-24 DIAGNOSIS — Z01419 Encounter for gynecological examination (general) (routine) without abnormal findings: Secondary | ICD-10-CM | POA: Diagnosis not present

## 2017-09-24 DIAGNOSIS — B373 Candidiasis of vulva and vagina: Secondary | ICD-10-CM | POA: Diagnosis not present

## 2017-09-24 DIAGNOSIS — Z124 Encounter for screening for malignant neoplasm of cervix: Secondary | ICD-10-CM

## 2017-09-24 DIAGNOSIS — Z113 Encounter for screening for infections with a predominantly sexual mode of transmission: Secondary | ICD-10-CM | POA: Diagnosis not present

## 2017-09-24 LAB — COMPREHENSIVE METABOLIC PANEL
ALBUMIN: 4.2 g/dL (ref 3.5–5.2)
ALT: 15 U/L (ref 0–35)
AST: 16 U/L (ref 0–37)
Alkaline Phosphatase: 32 U/L — ABNORMAL LOW (ref 39–117)
BUN: 8 mg/dL (ref 6–23)
CHLORIDE: 107 meq/L (ref 96–112)
CO2: 27 meq/L (ref 19–32)
CREATININE: 0.62 mg/dL (ref 0.40–1.20)
Calcium: 9.5 mg/dL (ref 8.4–10.5)
GFR: 127.23 mL/min (ref >60.00)
Glucose, Bld: 83 mg/dL (ref 70–99)
POTASSIUM: 4.1 meq/L (ref 3.5–5.1)
Sodium: 142 mEq/L (ref 135–145)
Total Bilirubin: 0.4 mg/dL (ref 0.2–1.2)
Total Protein: 6.9 g/dL (ref 6.0–8.3)

## 2017-09-24 LAB — CBC WITH DIFFERENTIAL/PLATELET
BASOS PCT: 0.7 % (ref 0.0–3.0)
Basophils Absolute: 0 10*3/uL (ref 0.0–0.1)
EOS ABS: 0.2 10*3/uL (ref 0.0–0.7)
EOS PCT: 3.7 % (ref 0.0–5.0)
HCT: 39.4 % (ref 36.0–46.0)
HEMOGLOBIN: 13.1 g/dL (ref 12.0–15.0)
LYMPHS PCT: 36.8 % (ref 12.0–46.0)
Lymphs Abs: 1.9 10*3/uL (ref 0.7–4.0)
MCHC: 33.3 g/dL (ref 30.0–36.0)
MCV: 91.3 fl (ref 78.0–100.0)
MONOS PCT: 7.7 % (ref 3.0–12.0)
Monocytes Absolute: 0.4 10*3/uL (ref 0.1–1.0)
NEUTROS ABS: 2.6 10*3/uL (ref 1.4–7.7)
Neutrophils Relative %: 51.1 % (ref 43.0–77.0)
PLATELETS: 197 10*3/uL (ref 150.0–400.0)
RBC: 4.32 Mil/uL (ref 3.87–5.11)
RDW: 12.2 % (ref 11.5–15.5)
WBC: 5.1 10*3/uL (ref 4.0–10.5)

## 2017-09-24 LAB — LIPID PANEL
CHOL/HDL RATIO: 4
CHOLESTEROL: 164 mg/dL (ref 0–200)
HDL: 45.6 mg/dL (ref >39.00)
LDL CALC: 98 mg/dL (ref 0–99)
NonHDL: 118.37
TRIGLYCERIDES: 103 mg/dL (ref 0.0–149.0)
VLDL: 20.6 mg/dL (ref 0.0–40.0)

## 2017-09-24 LAB — TSH: TSH: 0.78 u[IU]/mL (ref 0.35–4.50)

## 2017-09-24 NOTE — Assessment & Plan Note (Signed)
Reviewed preventive care protocols, scheduled due services, and updated immunizations Discussed nutrition, exercise, diet, and healthy lifestyle.  Orders Placed This Encounter  Procedures  . CBC with Differential/Platelet  . Comprehensive metabolic panel  . Lipid panel  . TSH  . HIV antibody (with reflex)  . RPR    

## 2017-09-24 NOTE — Progress Notes (Signed)
Subjective:   Patient ID: Emily HolsterAlyssa K Lowe, female    DOB: Apr 12, 1996, 21 y.o.   MRN: [id number]  Emily Lowe is a pleasant 21 y.o. year old female who presents to clinic today with Annual Exam (Patient is here today for a CPE & PAP. She is currently fasting.)  on 09/24/2017  HPI:  Has never had a pap smear. On Junel.   Lab Results  Component Value Date   CHOL 171 06/01/2013   HDL 51.80 06/01/2013   LDLCALC 101 (H) 06/01/2013   TRIG 89.0 06/01/2013   CHOLHDL 3 06/01/2013   Lab Results  Component Value Date   NA 141 01/24/2016   K 3.6 01/24/2016   CL 108 01/24/2016   CO2 24 01/24/2016   Lab Results  Component Value Date   WBC 11.0 (H) 01/24/2016   HGB 12.6 01/24/2016   HCT 38.4 01/24/2016   MCV 90.1 01/24/2016   PLT 216 01/24/2016   No results found for: TSH   Current Outpatient Prescriptions on File Prior to Visit  Medication Sig Dispense Refill  . ibuprofen (ADVIL,MOTRIN) 200 MG tablet Take 200 mg by mouth every 6 (six) hours as needed for headache.    . norethindrone-ethinyl estradiol (JUNEL FE 1/20) 1-20 MG-MCG tablet Take 1 tablet by mouth daily. 28 tablet 11  . sertraline (ZOLOFT) 50 MG tablet Take 1 tablet (50 mg total) by mouth daily. 30 tablet 11   No current facility-administered medications on file prior to visit.     No Known Allergies  Past Medical History:  Diagnosis Date  . Anxiety   . Kidney stones    followed by Dr. Achilles Dunkope  . OCD (obsessive compulsive disorder)     No past surgical history on file.  Family History  Problem Relation Age of Onset  . Hypertension Mother   . Gout Father     Social History   Social History  . Marital status: Single    Spouse name: N/A  . Number of children: N/A  . Years of education: N/A   Occupational History  . Not on file.   Social History Main Topics  . Smoking status: Never Smoker  . Smokeless tobacco: Never Used  . Alcohol use Yes     Comment: Occas.  . Drug use: No  . Sexual  activity: Yes    Partners: Male    Birth control/ protection: Pill   Other Topics Concern  . Not on file   Social History Narrative   Graduated from high school in 2014.  Going to [employer] in fall.  Physics major.  Thinking about engineering.   The PMH, PSH, Social History, Family History, Medications, and allergies have [facility], and have been updated if relevant.   Review of Systems  Constitutional: Negative.   HENT: Negative.   Respiratory: Negative.   Cardiovascular: Negative.   Gastrointestinal: Negative.   Endocrine: Negative.   Genitourinary: Negative.   Musculoskeletal: Negative.   Allergic/Immunologic: Negative.   Neurological: Negative.   Hematological: Negative.   Psychiatric/Behavioral: Negative.   All other systems reviewed and are negative.      Objective:    BP 106/74 (BP Location: Left Arm, Patient Position: Sitting, Cuff Size: Normal)   Pulse 67   Temp 97.8 F (36.6 C) (Oral)   Ht 5' (1.524 m)   Wt 113 lb 12.8 oz (51.6 kg)   LMP 09/08/2017   SpO2 99%   BMI 22.23 kg/m    Physical  Exam   General:  Well-developed,well-nourished,in no acute distress; alert,appropriate and cooperative throughout examination Head:  normocephalic and atraumatic.   Eyes:  vision grossly intact, PERRL Ears:  R ear normal and L ear normal externally, TMs clear bilaterally Nose:  no external deformity.   Mouth:  good dentition.   Neck:  No deformities, masses, or tenderness noted. Breasts:  No mass, nodules, thickening, tenderness, bulging, retraction, inflamation, nipple discharge or skin changes noted.   Lungs:  Normal respiratory effort, chest expands symmetrically. Lungs are clear to auscultation, no crackles or wheezes. Heart:  Normal rate and regular rhythm. S1 and S2 normal without gallop, murmur, click, rub or other extra sounds. Abdomen:  Bowel sounds positive,abdomen soft and non-tender without masses, organomegaly or hernias noted. Rectal:  no  external abnormalities.   Genitalia:  Pelvic Exam:        External: normal female genitalia without lesions or masses        Vagina: normal without lesions or masses        Cervix: normal without lesions or masses        Adnexa: normal bimanual exam without masses or fullness        Uterus: normal by palpation        Pap smear: performed Msk:  No deformity or scoliosis noted of thoracic or lumbar spine.   Extremities:  No clubbing, cyanosis, edema, or deformity noted with normal full range of motion of all joints.   Neurologic:  alert & oriented X3 and gait normal.   Skin:  Intact without suspicious lesions or rashes Cervical Nodes:  No lymphadenopathy noted Axillary Nodes:  No palpable lymphadenopathy Psych:  Cognition and judgment appear intact. Alert and cooperative with normal attention span and concentration. No apparent delusions, illusions, hallucinations       Assessment & Plan:   Well woman exam with routine gynecological exam No Follow-up on file.

## 2017-09-24 NOTE — Patient Instructions (Signed)
Great to see you.  We will call you with your results and you can view them online. 

## 2017-09-25 LAB — RPR: RPR Ser Ql: NONREACTIVE

## 2017-09-25 LAB — HIV ANTIBODY (ROUTINE TESTING W REFLEX): HIV 1&2 Ab, 4th Generation: NONREACTIVE

## 2017-09-26 LAB — CYTOLOGY - PAP
BACTERIAL VAGINITIS: NEGATIVE
Candida vaginitis: POSITIVE — AB
Chlamydia: NEGATIVE
Diagnosis: NEGATIVE
HPV: NOT DETECTED
NEISSERIA GONORRHEA: NEGATIVE
TRICH (WINDOWPATH): NEGATIVE

## 2017-09-27 ENCOUNTER — Other Ambulatory Visit: Payer: Self-pay | Admitting: Family Medicine

## 2017-09-27 MED ORDER — FLUCONAZOLE 150 MG PO TABS: 150.0000 mg | ORAL_TABLET | Freq: Once | ORAL | 0 refills | Status: AC

## 2017-12-16 ENCOUNTER — Encounter: Payer: Self-pay | Admitting: Family Medicine

## 2017-12-16 ENCOUNTER — Ambulatory Visit: Payer: Commercial Managed Care - PPO | Admitting: Family Medicine

## 2017-12-16 VITALS — BP 112/56 | HR 76 | Temp 98.1°F | Ht 60.0 in | Wt 115.6 lb

## 2017-12-16 DIAGNOSIS — N760 Acute vaginitis: Secondary | ICD-10-CM

## 2017-12-16 LAB — WET PREP, GENITAL
Bacteria: NONE SEEN — AB
Clue Cells Wet Prep HPF POC: NONE SEEN — AB
TRICH WET PREP: NONE SEEN — AB
WBC, Wet Prep HPF POC: NONE SEEN — AB
YEAST WET PREP: NONE SEEN — AB

## 2017-12-16 NOTE — Progress Notes (Signed)
SUBJECTIVE:  22 y.o. female complains of copious, foul, malodorous and milky vaginal discharge for 1 month(s). Denies abnormal vaginal bleeding or significant pelvic pain or fever. No UTI symptoms. Denies history of known exposure to STD.  Patient's last menstrual period was 11/29/2017.  Current Outpatient Medications on File Prior to Visit  Medication Sig Dispense Refill  . Biotin 1000 MCG tablet Take 1,000 mcg by mouth 3 (three) times daily.    Marland Kitchen. ibuprofen (ADVIL,MOTRIN) 200 MG tablet Take 200 mg by mouth every 6 (six) hours as needed for headache.    . norethindrone-ethinyl estradiol (JUNEL FE 1/20) 1-20 MG-MCG tablet Take 1 tablet by mouth daily. 28 tablet 11  . sertraline (ZOLOFT) 50 MG tablet Take 1 tablet (50 mg total) by mouth daily. 30 tablet 11   No current facility-administered medications on file prior to visit.     No Known Allergies  Past Medical History:  Diagnosis Date  . Anxiety   . Kidney stones    followed by Dr. Achilles Dunkope  . OCD (obsessive compulsive disorder)     No past surgical history on file.  Family History  Problem Relation Age of Onset  . Hypertension Mother   . Gout Father     Social History   Socioeconomic History  . Marital status: Single    Spouse name: Not on file  . Number of children: Not on file  . Years of education: Not on file  . Highest education level: Not on file  Social Needs  . Financial resource strain: Not on file  . Food insecurity - worry: Not on file  . Food insecurity - inability: Not on file  . Transportation needs - medical: Not on file  . Transportation needs - non-medical: Not on file  Occupational History  . Not on file  Tobacco Use  . Smoking status: Never Smoker  . Smokeless tobacco: Never Used  Substance and Sexual Activity  . Alcohol use: Yes    Comment: Occas.  . Drug use: No  . Sexual activity: Yes    Partners: Male    Birth control/protection: Pill  Other Topics Concern  . Not on file  Social  History Narrative   Graduated from high school in 2014.  Going to [employer] in fall.  Physics major.  Thinking about engineering.   The PMH, PSH, Social History, Family History, Medications, and allergies [facility], and have been updated if relevant.  OBJECTIVE:  BP (!) 112/56 (BP Location: Left Arm, Patient Position: Sitting, Cuff Size: Normal)   Pulse 76   Temp 98.1 F (36.7 C) (Oral)   Ht 5' (1.524 m)   Wt 115 lb 9.6 oz (52.4 kg)   LMP 11/29/2017   SpO2 100%   BMI 22.58 kg/m   She appears well, afebrile. Abdomen: benign, soft, nontender, no masses. Pelvic Exam: normal external genitalia, vulva, vagina, cervix, uterus and adnexa, discharge in vaginal vault, otherwise unremarkable exam.   ASSESSMENT:  rule out GC or chlamydia, nonspecific vaginitis and no pathogens identified causing these symptoms  PLAN:  GC and chlamydia DNA  probe sent to lab. Wet prep probe sent.

## 2017-12-16 NOTE — Patient Instructions (Signed)
Great to see you. We will call you with your results from today. 

## 2017-12-16 NOTE — Addendum Note (Signed)
Addended by: Lerry LinerFREDERICK, Rose Hegner. M on: 12/16/2017 02:51 PM   Modules accepted: Orders

## 2017-12-17 ENCOUNTER — Telehealth: Payer: Self-pay

## 2017-12-17 LAB — C. TRACHOMATIS/N. GONORRHOEAE RNA
C. trachomatis RNA, TMA: NOT DETECTED
N. gonorrhoeae RNA, TMA: NOT DETECTED

## 2017-12-17 NOTE — Telephone Encounter (Signed)
-----   Message from Dianne Dunalia M Aron, MD sent at 12/17/2017  8:58 AM EST ----- Please call pt- gonorrhea, chlamydia, yeast, BV, trichomonas all neg which is great news.  Has she maybe changed soaps or detergents or vaginal lubricants that could be causing irritation?

## 2017-12-17 NOTE — Telephone Encounter (Signed)
LMOVM stating that labs WNL and asked to RTC to advise answer to TA's questions/thx dmf

## 2018-08-15 ENCOUNTER — Other Ambulatory Visit: Payer: Self-pay | Admitting: Family Medicine

## 2018-09-08 ENCOUNTER — Other Ambulatory Visit: Payer: Self-pay | Admitting: Family Medicine

## 2018-10-11 ENCOUNTER — Other Ambulatory Visit: Payer: Self-pay | Admitting: Family Medicine

## 2018-11-10 ENCOUNTER — Other Ambulatory Visit: Payer: Self-pay | Admitting: Family Medicine

## 2018-11-18 ENCOUNTER — Ambulatory Visit: Payer: Commercial Managed Care - PPO | Admitting: Family Medicine

## 2018-11-18 ENCOUNTER — Encounter: Payer: Self-pay | Admitting: Family Medicine

## 2018-11-18 VITALS — BP 114/76 | HR 78 | Temp 98.6°F | Ht 60.0 in | Wt 116.4 lb

## 2018-11-18 DIAGNOSIS — F419 Anxiety disorder, unspecified: Secondary | ICD-10-CM

## 2018-11-18 DIAGNOSIS — H6122 Impacted cerumen, left ear: Secondary | ICD-10-CM | POA: Diagnosis not present

## 2018-11-18 DIAGNOSIS — F329 Major depressive disorder, single episode, unspecified: Secondary | ICD-10-CM

## 2018-11-18 DIAGNOSIS — F32A Depression, unspecified: Secondary | ICD-10-CM

## 2018-11-18 DIAGNOSIS — F429 Obsessive-compulsive disorder, unspecified: Secondary | ICD-10-CM

## 2018-11-18 NOTE — Assessment & Plan Note (Signed)
>  40 minutes spent in face to face time with patient, >50% spent in counselling or coordination of care discussing OCD, anxiety and depression.  She feels strongly that she does not want to be on any medication despite her depression and anxiety screens being higher today.  After long discussion, she is now willing to be referred to a therapist and we discussed techniques that seem to help her when her OCD symptoms worsen- she feels with or without symptoms, OCD worsens every few months and then gets better.  She feels acutely worse now because she is working at an [employer] so her germ fears are worse.  Psych referral placed. Orders Placed This Encounter  Procedures  . Ambulatory referral to Psychology

## 2018-11-18 NOTE — Assessment & Plan Note (Signed)
Ceruminosis is noted.  Wax is removed by syringing and manual debridement. Instructions for home care to prevent wax buildup are given.  

## 2018-11-18 NOTE — Patient Instructions (Addendum)
Great to see you. Happy holidays to you and your family.  In order to wean off zoloft- take one full tablet every other day for 1 week, then 1/2 tablet every other day for 2 weeks and stop.  We will be calling you about [facility]. Please call them at [phone]- ask for Dr. Spero GeraldsMichelle Lowe or Emily RothmanMary Lowe.   Earwax Buildup, Adult The ears produce a substance called earwax that helps keep bacteria out of the ear and protects the skin in the ear canal. Occasionally, earwax can build up in the ear and cause discomfort or hearing loss. What increases the risk? This condition is more likely to develop in people who:  Are female.  Are elderly.  Naturally produce more earwax.  Clean their ears often with cotton swabs.  Use earplugs often.  Use in-ear headphones often.  Wear hearing aids.  Have narrow ear canals.  Have earwax that is overly thick or sticky.  Have eczema.  Are dehydrated.  Have excess hair in the ear canal. What are the signs or symptoms? Symptoms of this condition include:  Reduced or muffled hearing.  A feeling of fullness in the ear or feeling that the ear is plugged.  Fluid coming from the ear.  Ear pain.  Ear itch.  Ringing in the ear.  Coughing.  An obvious piece of earwax that can be seen inside the ear canal. How is this diagnosed? This condition may be diagnosed based on:  Your symptoms.  Your medical history.  An ear exam. During the exam, your health care provider will look into your ear with an instrument called an otoscope. You may have tests, including a hearing test. How is this treated? This condition may be treated by:  Using ear drops to soften the earwax.  Having the earwax removed by a health care provider. The health care provider may: ? Flush the ear with water. ? Use an instrument that has a loop on the end (curette). ? Use a suction device.  Surgery to remove the wax buildup.  This may be done in severe cases. Follow these instructions at home:   Take over-the-counter and prescription medicines only as told by your health care provider.  Do not put any objects, including cotton swabs, into your ear. You can clean the opening of your ear canal with a washcloth or facial tissue. Follow instructions from your health care provider about cleaning your ears. Do not over-clean your ears.  Debrox OTC can help or Use dilute hydrogen peroxide (1:1 with water) a few drops in ear to help break down ear wax.  Drink enough fluid to keep your urine clear or pale yellow. This will help to thin the earwax.  Keep all follow-up visits as told by your health care provider. If earwax builds up in your ears often or if you use hearing aids, consider seeing your health care provider for routine, preventive ear cleanings. Ask your health care provider how often you should schedule your cleanings.  If you have hearing aids, clean them according to instructions from the manufacturer and your health care provider. Contact a health care provider if:  You have ear pain.  You develop a fever.  You have blood, pus, or other fluid coming from your ear.  You have hearing loss.  You have ringing in your ears that does not go away.  Your symptoms do not improve with treatment.  You feel like the room is spinning (  vertigo). Summary  Earwax can build up in the ear and cause discomfort or hearing loss.  The most common symptoms of this condition include reduced or muffled hearing and a feeling of fullness in the ear or feeling that the ear is plugged.  This condition may be diagnosed based on your symptoms, your medical history, and an ear exam.  This condition may be treated by using ear drops to soften the earwax or by having the earwax removed by a health care provider.  Do not put any objects, including cotton swabs, into your ear. You can clean the opening of your ear canal with a  washcloth or facial tissue. This information is not intended to replace advice given to you by your health care provider. Make sure you discuss any questions you have with your health care provider. Document Released: 12/20/2004 Document Revised: 10/24/2017 Document Reviewed: 01/23/2017 Elsevier Interactive Patient Education  2019 [employer]

## 2018-11-18 NOTE — Progress Notes (Signed)
Subjective:   Patient ID: Emily HolsterAlyssa K Lowe, female    DOB: [birth year], 22 y.o.   MRN: [id number]  Emily Holsterlyssa K Smallman is a pleasant 22 y.o. year old female who presents to clinic today with Discuss Medication (Patient is here today to discuss medication.  She states "I don't feel like the zoloft is doing much anymore and I don't want to keep increasing it.  I'm worried about what effects it has on my body because I forgot it once and was really dizzy." Basically discuss the weaning process.  States has been taking it for OCD and it does not help that at all.) and Ear Problem (She is also here today C/O not being able to hear out of her left ear.  She said that it is painful and is bleeding.  She waxed her ear on Thursday.  Boyfriend's mother did a OTC Tx for Swimmers ear but it was not helpful.)  on 11/18/2018  HPI:   OCD- she doesn't feel the zoloft is effective anymore and does not want to increase dose. She is worried about long term effects on her body.  And once she forgot to take it and was really dizzy.  Currently taking zoloft 50 mg daily.  She feels anxiety and OCD symptoms are worsening.  She feels she is having to wash her hands and shower more often- over the past few months.  She thinks being on medication makes her more anxious about needing to take something.  Wants to wean off zoloft, not start another rx.  But she is now willing to consider psychotherapy.   Wt Readings from Last 3 Encounters:  11/18/18 116 lb 6.4 oz (52.8 kg)  12/16/17 115 lb 9.6 oz (52.4 kg)  09/24/17 113 lb 12.8 oz (51.6 kg)     GAD 7 : Generalized Anxiety Score 11/18/2018  Nervous, Anxious, on Edge 3  Control/stop worrying 3  Worry too much - different things 3  Trouble relaxing 3  Restless 1  Easily annoyed or irritable 1  Afraid - awful might happen 1  Total GAD 7 Score 15  Anxiety Difficulty Somewhat difficult     Depression screen [facility] 2/9 11/18/2018 08/22/2017  Decreased Interest 1 0  Down,  Depressed, Hopeless 1 0  PHQ - 2 Score 2 0  Altered sleeping 2 -  Tired, decreased energy 1 -  Change in appetite 1 -  Feeling bad or failure about yourself  1 -  Trouble concentrating 2 -  Moving slowly or fidgety/restless 2 -  Suicidal thoughts 1 -  PHQ-9 Score 12 -  Difficult doing work/chores Somewhat difficult -     Left ear- loss of hearing.  Painful and sometimes bleeding.  She has tried an OTC tx for swimmer's ear that was not helpful.  Current Outpatient Medications on File Prior to Visit  Medication Sig Dispense Refill  . ibuprofen (ADVIL,MOTRIN) 200 MG tablet Take 200 mg by mouth every 6 (six) hours as needed for headache.    Colleen Can. JUNEL FE 1/20 1-20 MG-MCG tablet TAKE 1 TABLET BY MOUTH ONCE DAILY 28 tablet 11  . sertraline (ZOLOFT) 50 MG tablet TAKE 1 TABLET BY MOUTH ONCE DAILY 30 tablet 0   No current facility-administered medications on file prior to visit.     No Known Allergies  Past Medical History:  Diagnosis Date  . Anxiety   . Kidney stones    followed by Dr. Achilles Dunkope  . OCD (obsessive compulsive disorder)  No past surgical history on file.  Family History  Problem Relation Age of Onset  . Hypertension Mother   . Gout Father     Social History   Socioeconomic History  . Marital status: Single    Spouse name: Not on file  . Number of children: Not on file  . Years of education: Not on file  . Highest education level: Not on file  Occupational History  . Not on file  Social Needs  . Financial resource strain: Not on file  . Food insecurity:    Worry: Not on file    Inability: Not on file  . Transportation needs:    Medical: Not on file    Non-medical: Not on file  Tobacco Use  . Smoking status: Never Smoker  . Smokeless tobacco: Never Used  Substance and Sexual Activity  . Alcohol use: Yes    Comment: Occas.  . Drug use: No  . Sexual activity: Yes    Partners: Male    Birth control/protection: Pill  Lifestyle  . Physical activity:      Days per week: Not on file    Minutes per session: Not on file  . Stress: Not on file  Relationships  . Social connections:    Talks on phone: Not on file    Gets together: Not on file    Attends religious service: Not on file    Active member of club or organization: Not on file    Attends meetings of clubs or organizations: Not on file    Relationship status: Not on file  . Intimate partner violence:    Fear of current or ex partner: Not on file    Emotionally abused: Not on file    Physically abused: Not on file    Forced sexual activity: Not on file  Other Topics Concern  . Not on file  Social History Narrative   Graduated from high school in 2014.  Going to [employer] in fall.  Physics major.  Thinking about engineering.   The PMH, PSH, Social History, Family History, Medications, and allergies [facility], and have been updated if relevant.   Review of Systems  Constitutional: Negative.   HENT: Positive for ear pain and hearing loss. Negative for congestion, dental problem, drooling, ear discharge, facial swelling, mouth sores, nosebleeds, postnasal drip, rhinorrhea, sinus pressure, sinus pain, sneezing, sore throat, tinnitus, trouble swallowing and voice change.   Respiratory: Negative.   Cardiovascular: Negative.   Psychiatric/Behavioral: Positive for dysphoric mood. Negative for agitation, behavioral problems, confusion, decreased concentration, hallucinations, self-injury and sleep disturbance. The patient is nervous/anxious. The patient is not hyperactive.   All other systems reviewed and are negative.      Objective:    BP 114/76 (BP Location: Left Arm, Patient Position: Sitting, Cuff Size: Normal)   Pulse 78   Temp 98.6 F (37 C) (Oral)   Ht 5' (1.524 m)   Wt 116 lb 6.4 oz (52.8 kg)   LMP 11/08/2018   SpO2 99%   BMI 22.73 kg/m    Physical Exam Vitals signs and nursing note reviewed.  Constitutional:      Appearance: Normal appearance.   HENT:     Head: Normocephalic and atraumatic.     Right Ear: Tympanic membrane and ear canal normal.     Left Ear: Tympanic membrane and ear canal normal. There is impacted cerumen.  Cardiovascular:     Rate and Rhythm: Normal rate.  Pulmonary:  Effort: Pulmonary effort is normal.  Neurological:     General: No focal deficit present.     Mental Status: She is alert and oriented to person, place, and time.  Psychiatric:        Mood and Affect: Mood is anxious. Affect is flat.        Speech: Speech normal.        Behavior: Behavior normal.        Thought Content: Thought content normal.        Cognition and Memory: Cognition and memory normal.        Judgment: Judgment normal.     Comments: Flat affect (baseline)           Assessment & Plan:   Obsessive-compulsive disorder, unspecified type - Plan: Ambulatory referral to Psychology  Anxiety and depression - Plan: Ambulatory referral to Psychology No follow-ups on file.

## 2019-04-23 ENCOUNTER — Ambulatory Visit: Payer: Commercial Managed Care - PPO | Admitting: Family Medicine

## 2019-07-20 ENCOUNTER — Emergency Department (HOSPITAL_COMMUNITY)
Admission: EM | Admit: 2019-07-20 | Discharge: 2019-07-20 | Disposition: A | Discharge status: Home / Self Care | Payer: Commercial Managed Care - PPO | Attending: Emergency Medicine | Admitting: Emergency Medicine

## 2019-07-20 ENCOUNTER — Encounter (HOSPITAL_COMMUNITY): Payer: Self-pay | Admitting: Emergency Medicine

## 2019-07-20 ENCOUNTER — Other Ambulatory Visit: Payer: Self-pay

## 2019-07-20 DIAGNOSIS — R339 Retention of urine, unspecified: Secondary | ICD-10-CM | POA: Diagnosis present

## 2019-07-20 DIAGNOSIS — R300 Dysuria: Secondary | ICD-10-CM | POA: Diagnosis not present

## 2019-07-20 LAB — URINALYSIS, ROUTINE W REFLEX MICROSCOPIC
Bilirubin Urine: NEGATIVE
Glucose, UA: NEGATIVE mg/dL
Ketones, ur: NEGATIVE mg/dL
Nitrite: NEGATIVE
Protein, ur: NEGATIVE mg/dL
Specific Gravity, Urine: 1.015 (ref 1.005–1.030)
pH: 5 (ref 5.0–8.0)

## 2019-07-20 LAB — POC URINE PREG, ED: Preg Test, Ur: NEGATIVE

## 2019-07-20 MED ORDER — NITROFURANTOIN MONOHYD MACRO 100 MG PO CAPS: 100.0000 mg | ORAL_CAPSULE | Freq: Two times a day (BID) | ORAL | 0 refills | Status: DC

## 2019-07-20 MED ORDER — NITROFURANTOIN MONOHYD MACRO 100 MG PO CAPS
100.0000 mg | ORAL_CAPSULE | Freq: Once | ORAL | Status: AC
  Administered 2019-07-20: 100 mg via ORAL
  Filled 2019-07-20: qty 1

## 2019-07-20 NOTE — Discharge Instructions (Addendum)
Drink plenty of fluids.  Return if symptoms are getting worse. 

## 2019-07-20 NOTE — ED Notes (Signed)
Pt stating she is unable to provide urine sample at this time, given water and encouraged to call when she feels that she can urinate

## 2019-07-20 NOTE — ED Provider Notes (Signed)
[facility]-EMERGENCY DEPT Provider Note   CSN: [id number] Arrival date & time: 07/20/19  0327    History   Chief Complaint Chief Complaint  Patient presents with  . Urinary Retention    HPI Emily Lowe is a 23 y.o. female.   The history is provided by the patient.  She has history of kidney stones and obsessive-compulsive disorder and comes in because of an episode of severe flank pain which has resolved.  She states that for the last 3 days she has had some urinary urgency as well as a sense of incomplete bladder emptying.  Tonight, she had an episode of severe pain in the left flank which lasted about 20 minutes before resolving.  Pain is now completely gone.  When present, pain was rated at 9/10.  She denies fever, chills, sweats.  She denies nausea or vomiting.  Past Medical History:  Diagnosis Date  . Anxiety   . Kidney stones    followed by Dr. Achilles Dunkope  . OCD (obsessive compulsive disorder)     Patient Active Problem List   Diagnosis Date Noted  . Anxiety and depression 11/18/2018  . Hearing loss due to cerumen impaction, left 11/18/2018  . Contraception management 09/03/2016  . OCD (obsessive compulsive disorder) 11/10/2015  . Adjustment disorder with anxiety 02/08/2015  . Kidney stones     History reviewed. No pertinent surgical history.   OB History   No obstetric history on file.      Home Medications    Prior to Admission medications   Medication Sig Start Date End Date Taking? Authorizing Provider  ibuprofen (ADVIL,MOTRIN) 200 MG tablet Take 200 mg by mouth every 6 (six) hours as needed for headache.    [provider]  JUNEL FE 1/20 1-20 MG-MCG tablet TAKE 1 TABLET BY MOUTH ONCE DAILY 08/15/18   Dianne DunAron, Talia M, MD  sertraline (ZOLOFT) 50 MG tablet TAKE 1 TABLET BY MOUTH ONCE DAILY 10/13/18   Dianne DunAron, Talia M, MD    Family History Family History  Problem Relation Age of Onset  . Hypertension Mother   . Gout Father     Social History Social History   Tobacco Use  . Smoking status: Never Smoker  . Smokeless tobacco: Never Used  Substance Use Topics  . Alcohol use: Yes    Comment: Occas.  . Drug use: No     Allergies   Patient has no known allergies.   Review of Systems Review of Systems  All other systems reviewed and are negative.    Physical Exam Updated Vital Signs BP 110/81 (BP Location: Left Arm)   Pulse 97   Temp 98.9 F (37.2 C) (Oral)   Resp 18   Ht 5' (1.524 m)   Wt 57.6 kg   LMP 07/04/2019   SpO2 99%   BMI 24.80 kg/m   Physical Exam Vitals signs and nursing note reviewed.    23 year old fedmale, resting comfortably and in no acute distress. Vital signs are normal. Oxygen saturation is 99%, which is normal. Head is normocephalic and atraumatic. PERRLA, EOMI. Oropharynx is clear. Neck is nontender and supple without adenopathy or JVD. Back is nontender and there is no CVA tenderness. Lungs are clear without rales, wheezes, or rhonchi. Chest is nontender. Heart has regular rate and rhythm without murmur. Abdomen is soft, flat, nontender without masses or hepatosplenomegaly and peristalsis is normoactive. Extremities have no cyanosis or edema, full range of motion is present. Skin is warm  and dry without rash. Neurologic: Mental status is normal, cranial nerves are intact, there are no motor or sensory deficits.  ED Treatments / Results  Labs (all labs ordered are listed, but only abnormal results are displayed) Labs Reviewed  URINALYSIS, ROUTINE W REFLEX MICROSCOPIC - Abnormal; Notable for the following components:      Result Value   Hgb urine dipstick LARGE (*)    Leukocytes,Ua SMALL (*)    Bacteria, UA RARE (*)    All other components within normal limits  POC URINE PREG, ED   Procedures Procedures   Medications Ordered in ED Medications  nitrofurantoin (macrocrystal-monohydrate) (MACROBID) capsule 100 mg (has no administration in time range)      Initial Impression / Assessment and Plan / ED Course  I have reviewed the triage vital signs and the nursing notes.  Pertinent labs & imaging results that were available during my care of the patient were reviewed by me and considered in my medical decision making (see chart for details).  Symptoms suggestive of UTI, possible passed kidney stone.  Old records are reviewed, and she does have a prior renal stone protocol CT scan showing multiple bilateral renal calculi.  However, she is pain-free now, no indication for repeat imaging.  Will check urinalysis to look for evidence of infection.  Urinalysis does have 11-20 WBCs and rare bacteria although also has 21-50 RBCs.  This could conceivably be related to a passed kidney stone, or UTI.  Her flank pain has not recurred.  She is discharged with prescription for nitrofurantoin.  Return precautions discussed.  Follow-up with PCP following course of antibiotics.  Final Clinical Impressions(s) / ED Diagnoses   Final diagnoses:  Dysuria    ED Discharge Orders         Ordered    nitrofurantoin, macrocrystal-monohydrate, (MACROBID) 100 MG capsule  2 times daily     07/20/19 1696           Delora Fuel, MD 78/93/81 [phone]

## 2019-07-20 NOTE — ED Notes (Signed)
Delay on d/c due to macrobid not being verified.

## 2019-07-20 NOTE — ED Triage Notes (Signed)
Pt reports having urinary retention for the last 3 days with left flank pain.

## 2019-07-20 NOTE — ED Notes (Signed)
Sent message to pharmacy to send up meds ASAP. Pt prepared for d/c otherwise.

## 2019-11-25 ENCOUNTER — Ambulatory Visit: Payer: Commercial Managed Care - PPO | Admitting: Family Medicine

## 2019-12-13 ENCOUNTER — Inpatient Hospital Stay (EMERGENCY_DEPARTMENT_HOSPITAL)
Admission: AD | Admit: 2019-12-13 | Discharge: 2019-12-14 | Disposition: A | Discharge status: Home / Self Care | Payer: Commercial Managed Care - PPO | Source: Ambulatory Visit | Attending: Obstetrics and Gynecology | Admitting: Obstetrics and Gynecology

## 2019-12-13 ENCOUNTER — Other Ambulatory Visit: Payer: Self-pay

## 2019-12-13 ENCOUNTER — Encounter (HOSPITAL_COMMUNITY): Payer: Self-pay | Admitting: Obstetrics and Gynecology

## 2019-12-13 DIAGNOSIS — Z8249 Family history of ischemic heart disease and other diseases of the circulatory system: Secondary | ICD-10-CM | POA: Insufficient documentation

## 2019-12-13 DIAGNOSIS — Z87442 Personal history of urinary calculi: Secondary | ICD-10-CM | POA: Insufficient documentation

## 2019-12-13 DIAGNOSIS — O26891 Other specified pregnancy related conditions, first trimester: Secondary | ICD-10-CM | POA: Diagnosis not present

## 2019-12-13 DIAGNOSIS — Z3A08 8 weeks gestation of pregnancy: Secondary | ICD-10-CM | POA: Diagnosis not present

## 2019-12-13 DIAGNOSIS — M545 Low back pain, unspecified: Secondary | ICD-10-CM

## 2019-12-13 DIAGNOSIS — R109 Unspecified abdominal pain: Secondary | ICD-10-CM | POA: Diagnosis not present

## 2019-12-13 DIAGNOSIS — F419 Anxiety disorder, unspecified: Secondary | ICD-10-CM | POA: Diagnosis not present

## 2019-12-13 DIAGNOSIS — Z20822 Contact with and (suspected) exposure to covid-19: Secondary | ICD-10-CM | POA: Diagnosis not present

## 2019-12-13 DIAGNOSIS — R1030 Lower abdominal pain, unspecified: Secondary | ICD-10-CM

## 2019-12-13 DIAGNOSIS — Z3A09 9 weeks gestation of pregnancy: Secondary | ICD-10-CM | POA: Diagnosis not present

## 2019-12-13 DIAGNOSIS — O99891 Other specified diseases and conditions complicating pregnancy: Secondary | ICD-10-CM | POA: Insufficient documentation

## 2019-12-13 DIAGNOSIS — O26831 Pregnancy related renal disease, first trimester: Secondary | ICD-10-CM

## 2019-12-13 DIAGNOSIS — N200 Calculus of kidney: Secondary | ICD-10-CM

## 2019-12-13 DIAGNOSIS — N132 Hydronephrosis with renal and ureteral calculous obstruction: Secondary | ICD-10-CM | POA: Insufficient documentation

## 2019-12-13 DIAGNOSIS — Z79899 Other long term (current) drug therapy: Secondary | ICD-10-CM | POA: Diagnosis not present

## 2019-12-13 DIAGNOSIS — N138 Other obstructive and reflux uropathy: Secondary | ICD-10-CM

## 2019-12-13 DIAGNOSIS — O99341 Other mental disorders complicating pregnancy, first trimester: Secondary | ICD-10-CM | POA: Diagnosis not present

## 2019-12-13 LAB — URINALYSIS, ROUTINE W REFLEX MICROSCOPIC
Bacteria, UA: NONE SEEN
Bilirubin Urine: NEGATIVE
Glucose, UA: NEGATIVE mg/dL
Ketones, ur: 5 mg/dL — AB
Nitrite: NEGATIVE
Protein, ur: 100 mg/dL — AB
RBC / HPF: >50 RBC/hpf — ABNORMAL HIGH (ref 0–5)
Specific Gravity, Urine: 1.026 (ref 1.005–1.030)
WBC, UA: >50 WBC/hpf — ABNORMAL HIGH (ref 0–5)
pH: 8 (ref 5.0–8.0)

## 2019-12-13 LAB — CBC
HCT: 37.2 % (ref 36.0–46.0)
Hemoglobin: 12.3 g/dL (ref 12.0–15.0)
MCH: 29.6 pg (ref 26.0–34.0)
MCHC: 33.1 g/dL (ref 30.0–36.0)
MCV: 89.6 fL (ref 80.0–100.0)
Platelets: 275 10*3/uL (ref 150–400)
RBC: 4.15 MIL/uL (ref 3.87–5.11)
RDW: 11.9 % (ref 11.5–15.5)
WBC: 15.1 10*3/uL — ABNORMAL HIGH (ref 4.0–10.5)
nRBC: 0 % (ref 0.0–0.2)

## 2019-12-13 LAB — POCT PREGNANCY, URINE: Preg Test, Ur: POSITIVE — AB

## 2019-12-13 NOTE — MAU Note (Signed)
Pt here with reports left sided flank pain that radiates into LLQ. Pain started on Wednesday. Pt reports history of kidney stones. Reported symptoms to OB and was prescribed oxycodone and Flomax-last took 3-4 hours ago. Pt reports the first few times she took them, it helped, but the last time she took it did not help. Pt reports blood in urine. Denies pain or burning, frequency/urgency. Pt denies fever.

## 2019-12-13 NOTE — MAU Note (Signed)
Not in lobby

## 2019-12-14 ENCOUNTER — Inpatient Hospital Stay (HOSPITAL_COMMUNITY): Payer: Commercial Managed Care - PPO

## 2019-12-14 ENCOUNTER — Inpatient Hospital Stay: Admission: AD | Admit: 2019-12-14 | Payer: Commercial Managed Care - PPO | Source: Ambulatory Visit | Admitting: Urology

## 2019-12-14 ENCOUNTER — Observation Stay (HOSPITAL_COMMUNITY)
Admission: AD | Admit: 2019-12-14 | Discharge: 2019-12-16 | Disposition: A | Discharge status: Home / Self Care | Payer: Commercial Managed Care - PPO | Attending: Obstetrics and Gynecology | Admitting: Obstetrics and Gynecology

## 2019-12-14 ENCOUNTER — Encounter (HOSPITAL_COMMUNITY): Payer: Self-pay | Admitting: Obstetrics and Gynecology

## 2019-12-14 DIAGNOSIS — Z79899 Other long term (current) drug therapy: Secondary | ICD-10-CM | POA: Insufficient documentation

## 2019-12-14 DIAGNOSIS — Z3A09 9 weeks gestation of pregnancy: Secondary | ICD-10-CM

## 2019-12-14 DIAGNOSIS — N200 Calculus of kidney: Secondary | ICD-10-CM

## 2019-12-14 DIAGNOSIS — N132 Hydronephrosis with renal and ureteral calculous obstruction: Secondary | ICD-10-CM | POA: Insufficient documentation

## 2019-12-14 DIAGNOSIS — N1330 Unspecified hydronephrosis: Secondary | ICD-10-CM

## 2019-12-14 DIAGNOSIS — O99891 Other specified diseases and conditions complicating pregnancy: Secondary | ICD-10-CM

## 2019-12-14 DIAGNOSIS — Z3A08 8 weeks gestation of pregnancy: Secondary | ICD-10-CM | POA: Insufficient documentation

## 2019-12-14 DIAGNOSIS — O99341 Other mental disorders complicating pregnancy, first trimester: Secondary | ICD-10-CM | POA: Insufficient documentation

## 2019-12-14 DIAGNOSIS — F419 Anxiety disorder, unspecified: Secondary | ICD-10-CM | POA: Insufficient documentation

## 2019-12-14 DIAGNOSIS — Z20822 Contact with and (suspected) exposure to covid-19: Secondary | ICD-10-CM | POA: Insufficient documentation

## 2019-12-14 DIAGNOSIS — O26831 Pregnancy related renal disease, first trimester: Principal | ICD-10-CM | POA: Insufficient documentation

## 2019-12-14 LAB — COMPREHENSIVE METABOLIC PANEL
ALT: 20 U/L (ref 0–44)
AST: 23 U/L (ref 15–41)
Albumin: 3.8 g/dL (ref 3.5–5.0)
Alkaline Phosphatase: 34 U/L — ABNORMAL LOW (ref 38–126)
Anion gap: 10 (ref 5–15)
BUN: 10 mg/dL (ref 6–20)
CO2: 24 mmol/L (ref 22–32)
Calcium: 9.5 mg/dL (ref 8.9–10.3)
Chloride: 101 mmol/L (ref 98–111)
Creatinine, Ser: 0.62 mg/dL (ref 0.44–1.00)
GFR calc Af Amer: >60 mL/min (ref >60)
GFR calc non Af Amer: >60 mL/min (ref >60)
Glucose, Bld: 102 mg/dL — ABNORMAL HIGH (ref 70–99)
Potassium: 4.1 mmol/L (ref 3.5–5.1)
Sodium: 135 mmol/L (ref 135–145)
Total Bilirubin: 0.7 mg/dL (ref 0.3–1.2)
Total Protein: 7.1 g/dL (ref 6.5–8.1)

## 2019-12-14 LAB — CBC WITH DIFFERENTIAL/PLATELET
Abs Immature Granulocytes: 0.06 10*3/uL (ref 0.00–0.07)
Basophils Absolute: 0 10*3/uL (ref 0.0–0.1)
Basophils Relative: 0 %
Eosinophils Absolute: 0.1 10*3/uL (ref 0.0–0.5)
Eosinophils Relative: 1 %
HCT: 37.2 % (ref 36.0–46.0)
Hemoglobin: 12.3 g/dL (ref 12.0–15.0)
Immature Granulocytes: 0 %
Lymphocytes Relative: 9 %
Lymphs Abs: 1.4 10*3/uL (ref 0.7–4.0)
MCH: 29.7 pg (ref 26.0–34.0)
MCHC: 33.1 g/dL (ref 30.0–36.0)
MCV: 89.9 fL (ref 80.0–100.0)
Monocytes Absolute: 0.8 10*3/uL (ref 0.1–1.0)
Monocytes Relative: 5 %
Neutro Abs: 12.7 10*3/uL — ABNORMAL HIGH (ref 1.7–7.7)
Neutrophils Relative %: 85 %
Platelets: 284 10*3/uL (ref 150–400)
RBC: 4.14 MIL/uL (ref 3.87–5.11)
RDW: 12 % (ref 11.5–15.5)
WBC: 15 10*3/uL — ABNORMAL HIGH (ref 4.0–10.5)
nRBC: 0 % (ref 0.0–0.2)

## 2019-12-14 LAB — URINALYSIS, ROUTINE W REFLEX MICROSCOPIC
Bilirubin Urine: NEGATIVE
Glucose, UA: NEGATIVE mg/dL
Ketones, ur: 20 mg/dL — AB
Nitrite: NEGATIVE
Protein, ur: 30 mg/dL — AB
Specific Gravity, Urine: 1.014 (ref 1.005–1.030)
pH: 7 (ref 5.0–8.0)

## 2019-12-14 LAB — GC/CHLAMYDIA PROBE AMP (~~LOC~~) NOT AT ARMC
Chlamydia: NEGATIVE
Comment: NEGATIVE
Comment: NORMAL
Neisseria Gonorrhea: NEGATIVE

## 2019-12-14 LAB — WET PREP, GENITAL
Clue Cells Wet Prep HPF POC: NONE SEEN
Sperm: NONE SEEN
Trich, Wet Prep: NONE SEEN

## 2019-12-14 LAB — TYPE AND SCREEN
ABO/RH(D): B POS
Antibody Screen: NEGATIVE

## 2019-12-14 LAB — SARS CORONAVIRUS 2 (TAT 6-24 HRS): SARS Coronavirus 2: NEGATIVE

## 2019-12-14 LAB — ABO/RH: ABO/RH(D): B POS

## 2019-12-14 LAB — HCG, QUANTITATIVE, PREGNANCY: hCG, Beta Chain, Quant, S: 141485 m[IU]/mL — ABNORMAL HIGH (ref <5)

## 2019-12-14 MED ORDER — DEXTROSE-NACL 5-0.45 % IV SOLN: INTRAVENOUS | Status: DC

## 2019-12-14 MED ORDER — DOCUSATE SODIUM 100 MG PO CAPS
100.0000 mg | ORAL_CAPSULE | Freq: Every day | ORAL | Status: DC
  Administered 2019-12-16: 11:00:00 100 mg via ORAL
  Filled 2019-12-14: qty 1

## 2019-12-14 MED ORDER — ZOLPIDEM TARTRATE 5 MG PO TABS
5.0000 mg | ORAL_TABLET | Freq: Every evening | ORAL | Status: DC | PRN
  Administered 2019-12-15: 23:00:00 5 mg via ORAL
  Filled 2019-12-14: qty 1

## 2019-12-14 MED ORDER — NALBUPHINE HCL 10 MG/ML IJ SOLN
10.0000 mg | Freq: Once | INTRAMUSCULAR | Status: AC
  Administered 2019-12-14: 10 mg via INTRAMUSCULAR
  Filled 2019-12-14: qty 1

## 2019-12-14 MED ORDER — ACETAMINOPHEN 325 MG PO TABS
650.0000 mg | ORAL_TABLET | ORAL | Status: DC | PRN
  Administered 2019-12-14 – 2019-12-15 (×2): 650 mg via ORAL
  Filled 2019-12-14 (×2): qty 2

## 2019-12-14 MED ORDER — CALCIUM CARBONATE ANTACID 500 MG PO CHEW: 2.0000 | CHEWABLE_TABLET | ORAL | Status: DC | PRN

## 2019-12-14 MED ORDER — HYDROMORPHONE HCL 1 MG/ML IJ SOLN
1.0000 mg | INTRAMUSCULAR | Status: DC | PRN
  Administered 2019-12-14: 18:00:00 1 mg via INTRAVENOUS
  Filled 2019-12-14: qty 1

## 2019-12-14 MED ORDER — PROMETHAZINE HCL 25 MG/ML IJ SOLN
12.5000 mg | Freq: Once | INTRAMUSCULAR | Status: AC
  Administered 2019-12-14: 12.5 mg via INTRAMUSCULAR
  Filled 2019-12-14: qty 1

## 2019-12-14 MED ORDER — PRENATAL MULTIVITAMIN CH
1.0000 | ORAL_TABLET | Freq: Every day | ORAL | Status: DC
  Administered 2019-12-16: 12:00:00 1 via ORAL
  Filled 2019-12-14: qty 1

## 2019-12-14 MED ORDER — PROMETHAZINE HCL 12.5 MG PO TABS: 12.5000 mg | ORAL_TABLET | Freq: Four times a day (QID) | ORAL | 0 refills | Status: DC | PRN

## 2019-12-14 MED ORDER — PROMETHAZINE HCL 25 MG/ML IJ SOLN: 25.0000 mg | Freq: Four times a day (QID) | INTRAMUSCULAR | Status: DC | PRN

## 2019-12-14 NOTE — MAU Provider Note (Signed)
Chief Complaint: Pain management   First Provider Initiated Contact with Patient 12/14/19 1509     SUBJECTIVE HPI: Emily Lowe is a 24 y.o. G1P0 at [redacted]w[redacted]d who presents to M[facility] Admissions reporting flank pain. Patient diagnosed with a 9 mm obstruction kidney stone last night. Pain on left side. Had follow up with urology this morning. Pain poorly controlled with morphine in the office so patient sent here for inpatient pain management. She will have a tube placed tomorrow per her urologist.  Reports left flank pain & nausea vomiting. Hasn't been able to keep down food today. Pain is currently 1/10 after taking tylenol & percocet. Denies fever/chills, abdominal pain, dysuria, or vaginal bleeding.   Location: left flank  Quality: aching Severity: 1/10 on pain scale Duration: days Timing: constant Modifying factors: improved now after morphine, tylenol, & percocet Associated signs and symptoms: n/v  Past Medical History:  Diagnosis Date  . Anxiety   . Kidney stones    followed by Dr. Achilles Dunk  . OCD (obsessive compulsive disorder)    OB History  Gravida Para Term Preterm AB Living  1            SAB TAB Ectopic Multiple Live Births               # Outcome Date GA Lbr Len/2nd Weight Sex Delivery Anes PTL Lv  1 Current            Past Surgical History:  Procedure Laterality Date  . WISDOM TOOTH EXTRACTION     Social History   Socioeconomic History  . Marital status: Single    Spouse name: Not on file  . Number of children: Not on file  . Years of education: Not on file  . Highest education level: Not on file  Occupational History  . Not on file  Tobacco Use  . Smoking status: Never Smoker  . Smokeless tobacco: Never Used  Substance and Sexual Activity  . Alcohol use: Yes    Comment: Occas.  . Drug use: No  . Sexual activity: Yes    Partners: Male  Other Topics Concern  . Not on file  Social History Narrative   Graduated from high school in 2014.  Going to [employer]  in fall.  Physics major.  Thinking about engineering.   Social Determinants of Health   Financial Resource Strain:   . Difficulty of Paying Living Expenses: Not on file  Food Insecurity:   . Worried About Programme researcher, broadcasting/film/video in the Last Year: Not on file  . Ran Out of Food in the Last Year: Not on file  Transportation Needs:   . Lack of Transportation (Medical): Not on file  . Lack of Transportation (Non-Medical): Not on file  Physical Activity:   . Days of Exercise per Week: Not on file  . Minutes of Exercise per Session: Not on file  Stress:   . Feeling of Stress : Not on file  Social Connections:   . Frequency of Communication with Friends and Family: Not on file  . Frequency of Social Gatherings with Friends and Family: Not on file  . Attends Religious Services: Not on file  . Active Member of Clubs or Organizations: Not on file  . Attends Banker Meetings: Not on file  . Marital Status: Not on file  Intimate Partner Violence:   . Fear of Current or Ex-Partner: Not on file  . Emotionally Abused: Not on file  . Physically Abused:  Not on file  . Sexually Abused: Not on file   Family History  Problem Relation Age of Onset  . Hypertension Mother   . Gout Father    No current facility-administered medications on file prior to encounter.   Current Outpatient Medications on File Prior to Encounter  Medication Sig Dispense Refill  . acetaminophen (TYLENOL) 500 MG tablet Take 500 mg by mouth every 6 (six) hours as needed.    . tamsulosin (FLOMAX) 0.4 MG CAPS capsule Take 0.4 mg by mouth.    . oxyCODONE-acetaminophen (PERCOCET/ROXICET) 5-325 MG tablet Take 1 tablet by mouth every 4 (four) hours as needed for severe pain.    . promethazine (PHENERGAN) 12.5 MG tablet Take 1 tablet (12.5 mg total) by mouth every 6 (six) hours as needed for nausea or vomiting. 30 tablet 0  . [DISCONTINUED] JUNEL FE 1/20 1-20 MG-MCG tablet TAKE 1 TABLET BY MOUTH ONCE DAILY (Patient not  taking: Reported on 07/20/2019) 28 tablet 11  . [DISCONTINUED] sertraline (ZOLOFT) 50 MG tablet TAKE 1 TABLET BY MOUTH ONCE DAILY (Patient not taking: Reported on 07/20/2019) 30 tablet 0   No Known Allergies  I have reviewed patient's Past Medical Hx, Surgical Hx, Family Hx, Social Hx, medications and allergies.   Review of Systems  Constitutional: Negative.   Gastrointestinal: Positive for nausea and vomiting. Negative for abdominal pain, constipation and diarrhea.  Genitourinary: Positive for flank pain. Negative for dysuria, hematuria and vaginal bleeding.    OBJECTIVE Patient Vitals for the past 24 hrs:  BP Temp Temp src Pulse Resp SpO2  12/14/19 1422 (!) 108/59 98.3 F (36.8 C) Oral 89 16 100 %   Constitutional: Well-developed, well-nourished female in no acute distress.  Cardiovascular: normal rate & rhythm, no murmur Respiratory: normal rate and effort. Lung sounds clear throughout GI: Abd soft, non-tender, Pos BS x 4. No guarding or rebound tenderness MS: Extremities nontender, no edema, normal ROM Neurologic: Alert and oriented x 4.    LAB RESULTS Results for orders placed or performed during the hospital encounter of 12/13/19 (from the past 24 hour(s))  Urinalysis, Routine w reflex microscopic     Status: Abnormal   Collection Time: 12/13/19 10:58 PM  Result Value Ref Range   Color, Urine YELLOW YELLOW   APPearance CLOUDY (A) CLEAR   Specific Gravity, Urine 1.026 1.005 - 1.030   pH 8.0 5.0 - 8.0   Glucose, UA NEGATIVE NEGATIVE mg/dL   Hgb urine dipstick LARGE (A) NEGATIVE   Bilirubin Urine NEGATIVE NEGATIVE   Ketones, ur 5 (A) NEGATIVE mg/dL   Protein, ur 100 (A) NEGATIVE mg/dL   Nitrite NEGATIVE NEGATIVE   Leukocytes,Ua MODERATE (A) NEGATIVE   RBC / HPF >50 (H) 0 - 5 RBC/hpf   WBC, UA >50 (H) 0 - 5 WBC/hpf   Bacteria, UA NONE SEEN NONE SEEN   Squamous Epithelial / LPF 0-5 0 - 5   Mucus PRESENT   Pregnancy, urine POC     Status: Abnormal   Collection Time:  12/13/19 11:00 PM  Result Value Ref Range   Preg Test, Ur POSITIVE (A) NEGATIVE  CBC     Status: Abnormal   Collection Time: 12/13/19 11:25 PM  Result Value Ref Range   WBC 15.1 (H) 4.0 - 10.5 K/uL   RBC 4.15 3.87 - 5.11 MIL/uL   Hemoglobin 12.3 12.0 - 15.0 g/dL   HCT 37.2 36.0 - 46.0 %   MCV 89.6 80.0 - 100.0 fL   MCH 29.6 26.0 -  34.0 pg   MCHC 33.1 30.0 - 36.0 g/dL   RDW 37.1 69.6 - 78.9 %   Platelets 275 150 - 400 K/uL   nRBC 0.0 0.0 - 0.2 %  ABO/Rh     Status: None   Collection Time: 12/13/19 11:25 PM  Result Value Ref Range   ABO/RH(D) B POS    No rh immune globuloin      NOT A RH IMMUNE GLOBULIN CANDIDATE, PT RH POSITIVE Performed at [facility], [address], [place], [place] [address]   hCG, quantitative, pregnancy     Status: Abnormal   Collection Time: 12/13/19 11:25 PM  Result Value Ref Range   hCG, Beta Chain, Quant, S 141,485 (H) <5 mIU/mL  CBC with Differential/Platelet     Status: Abnormal   Collection Time: 12/14/19 12:02 AM  Result Value Ref Range   WBC 15.0 (H) 4.0 - 10.5 K/uL   RBC 4.14 3.87 - 5.11 MIL/uL   Hemoglobin 12.3 12.0 - 15.0 g/dL   HCT 75.1 02.5 - 85.2 %   MCV 89.9 80.0 - 100.0 fL   MCH 29.7 26.0 - 34.0 pg   MCHC 33.1 30.0 - 36.0 g/dL   RDW 77.8 24.2 - 35.3 %   Platelets 284 150 - 400 K/uL   nRBC 0.0 0.0 - 0.2 %   Neutrophils Relative % 85 %   Neutro Abs 12.7 (H) 1.7 - 7.7 K/uL   Lymphocytes Relative 9 %   Lymphs Abs 1.4 0.7 - 4.0 K/uL   Monocytes Relative 5 %   Monocytes Absolute 0.8 0.1 - 1.0 K/uL   Eosinophils Relative 1 %   Eosinophils Absolute 0.1 0.0 - 0.5 K/uL   Basophils Relative 0 %   Basophils Absolute 0.0 0.0 - 0.1 K/uL   Immature Granulocytes 0 %   Abs Immature Granulocytes 0.06 0.00 - 0.07 K/uL  Comprehensive metabolic panel     Status: Abnormal   Collection Time: 12/14/19 12:09 AM  Result Value Ref Range   Sodium 135 135 - 145 mmol/L   Potassium 4.1 3.5 - 5.1 mmol/L   Chloride 101 98 - 111 mmol/L   CO2 24  22 - 32 mmol/L   Glucose, Bld 102 (H) 70 - 99 mg/dL   BUN 10 6 - 20 mg/dL   Creatinine, Ser 6.14 0.44 - 1.00 mg/dL   Calcium 9.5 8.9 - 43.1 mg/dL   Total Protein 7.1 6.5 - 8.1 g/dL   Albumin 3.8 3.5 - 5.0 g/dL   AST 23 15 - 41 U/L   ALT 20 0 - 44 U/L   Alkaline Phosphatase 34 (L) 38 - 126 U/L   Total Bilirubin 0.7 0.3 - 1.2 mg/dL   GFR calc non Af Amer >60 >60 mL/min   GFR calc Af Amer >60 >60 mL/min   Anion gap 10 5 - 15  Wet prep, genital     Status: Abnormal   Collection Time: 12/14/19 12:20 AM  Result Value Ref Range   Yeast Wet Prep HPF POC PRESENT (A) NONE SEEN   Trich, Wet Prep NONE SEEN NONE SEEN   Clue Cells Wet Prep HPF POC NONE SEEN NONE SEEN   WBC, Wet Prep HPF POC FEW (A) NONE SEEN   Sperm NONE SEEN     IMAGING US OB Comp Less 14 Wks  Result Date: 12/14/2019 CLINICAL DATA:  Pregnant patient in first-trimester pregnancy with low back pain. EXAM: OBSTETRIC <14 WK ULTRASOUND TECHNIQUE: Transabdominal ultrasound was performed for  evaluation of the gestation as well as the maternal uterus and adnexal regions. COMPARISON:  None. FINDINGS: Intrauterine gestational sac: Single Yolk sac:  Visualized. Embryo:  Visualized. Cardiac Activity: Visualized. Heart Rate: 169 bpm CRL:   30.3 mm   9 w 6 d                  [place] EDC: 07/12/2020 Subchorionic hemorrhage:  None visualized. Maternal uterus/adnexae: Both ovaries are visualized and are normal. Corpus luteal cyst in the left ovary. No pelvic free fluid or adnexal mass. IMPRESSION: 1. Single live intrauterine pregnancy estimated gestational age [redacted] weeks 6 days based on crown-rump length for ultrasound [facility] 07/12/2020. No subchorionic hemorrhage. 2. Normal sonographic appearance of the ovaries. Electronically Signed   By: Narda Rutherford M.D.   On: 12/14/2019 01:12   US Renal  Result Date: 12/14/2019 CLINICAL DATA:  Initial evaluation for acute left flank pain. EXAM: RENAL / URINARY TRACT ULTRASOUND COMPLETE COMPARISON:  Prior CT from  01/25/2016. FINDINGS: Right Kidney: Renal measurements: 11.1 x 4.1 x 4.8 cm = volume: 112.5 mL. Echogenicity within normal limits. Two adjacent nonobstructive calculi seen within the interpolar kidney, largest of which measures 9 mm. No hydronephrosis. No discrete renal mass. Left Kidney: Renal measurements: 12.0 x 7.2 x 5.0 cm = volume: 226.4 mL. Echogenicity within normal limits. No discrete renal mass. There is a 9 x 5 x 8 mm obstructive stone within the proximal left ureter with secondary moderate left hydronephrosis. Additional nonobstructive calculi within the interpolar left kidney measure up to 6 mm. Bladder: Appears normal for degree of bladder distention. Left ureteral jet not visualized. A right jet is seen. Other: None. IMPRESSION: 1. 9 x 5 x 8 mm obstructive stone within the proximal left ureter with secondary moderate left hydronephrosis. 2. Additional bilateral nonobstructive nephrolithiasis as above. Electronically Signed   By: Rise Mu M.D.   On: 12/14/2019 01:49    MAU COURSE Orders Placed This Encounter  Procedures  . Urinalysis, Routine w reflex microscopic   No orders of the defined types were placed in this encounter.   MDM Pt informed that the ultrasound is considered a limited OB ultrasound and is not intended to be a complete ultrasound exam.  Patient also informed that the ultrasound is not being completed with the intent of assessing for fetal or placental anomalies or any pelvic abnormalities.  Explained that the purpose of today's ultrasound is to assess for  viability.  Patient acknowledges the purpose of the exam and the limitations of the study.  Live IUP, FHR 167 bpm   ASSESSMENT 1. Pregnancy complicated by nephrolithiasis in first trimester, antepartum   2. [redacted] weeks gestation of pregnancy     PLAN Obs on OBSC unit Pain management - dilaudid 1 mg Q2 hour prn Phenergan prn nausea/vomiting Dr. Arita Miss to place orders regarding urological  procedure   Judeth Horn, NP 12/14/2019  3:09 PM

## 2019-12-14 NOTE — MAU Note (Signed)
Emily Lowe is a 25 y.o. at [redacted]w[redacted]d here in MAU reporting: was here yesterday and referred to urology. States she called [facility] today and they told her they could not place a stent or do anything until her 2nd trimester. Took 2 doses of her percocet and was given morphine today at [facility] and nothing has helped her pain. Came in because the urologist told her to come in if she thinks she needs to stay in the hospital for pain management. States she is having a tube placed in her kidney tomorrow. Is not currently having pain.  Onset of complaint: ongoing  Pain score: 0/10  Vitals:   12/14/19 1422  BP: (!) 108/59  Pulse: 89  Resp: 16  Temp: 98.3 F (36.8 C)  SpO2: 100%      Lab orders placed from triage: UA

## 2019-12-14 NOTE — H&P (Addendum)
Emily Lowe is a 24 y.o. female G1P0 at 9+6 with obstructing kidney stone for nephrostomy tube placement by IR 12/15/19.  Admitted overnight for pain control.  Have done bedside US for FHTs.  D/W pt pain control with tylenol/dilaudid.  D/W pt.  Also N/V of pregnancy  OB History    Gravida  1   Para      Term      Preterm      AB      Living        SAB      TAB      Ectopic      Multiple      Live Births            no abn pap No STD  G1 present  Past Medical History:  Diagnosis Date  . Anxiety   . Kidney stones    followed by Dr. Achilles Dunk  . OCD (obsessive compulsive disorder)    Past Surgical History:  Procedure Laterality Date  . WISDOM TOOTH EXTRACTION     Family History: family history includes Gout in her father; Hypertension in her mother. DM Social History:  reports that she has never smoked. She has never used smokeless tobacco. She reports current alcohol use. She reports that she does not use drugs. single (FOB involved)  Meds Dramamine, Flomax, Oxycodone, PNV, Unisom/B6  All NKDA   Review of Systems  Constitutional: Negative.   HENT: Negative.   Eyes: Negative.   Respiratory: Negative.   Cardiovascular: Negative.   Gastrointestinal: Negative.   Genitourinary: Positive for flank pain and pelvic pain.  Skin: Negative.   Neurological: Negative.   Psychiatric/Behavioral: Negative.    Maternal Medical History:  Reason for admission: Kidney stone  Prenatal complications: H/o kidney stones  Prenatal Complications - Diabetes: none.      Blood pressure (!) 99/57, pulse 89, temperature 97.9 F (36.6 C), resp. rate 16, height 5' (1.524 m), weight 59.4 kg, last menstrual period 07/04/2019, SpO2 98 %. Exam Physical Exam  Constitutional: She is oriented to person, place, and time. She appears well-developed and well-nourished.  uncomfortable  HENT:  Head: Normocephalic and atraumatic.  Cardiovascular: Normal rate and regular rhythm.   Respiratory: Effort normal and breath sounds normal. No respiratory distress. She has no wheezes.  GI: Soft. Bowel sounds are normal. She exhibits no distension. There is no abdominal tenderness.  Musculoskeletal:        General: Normal range of motion.  Neurological: She is alert and oriented to person, place, and time.  Skin: Skin is warm and dry.  Psychiatric: She has a normal mood and affect. Her behavior is normal.    Prenatal labs: ABO, Rh: --/--/B POS (01/18 1550) Antibody: NEG (01/18 1550) Rubella:   RPR:    HBsAg:    HIV:    GBS:     Assessment/Plan: 23yo G1P0 at 9+6 with obstructing kidney stone for neph tube placement by IR 12/15/19 Admitted O/N for pain control (Dilaudid, Tylenol) Phenergan and TUMS for NVP Ambien for sleep  Emily Lowe 12/14/2019, 5:41 PM

## 2019-12-14 NOTE — Consult Note (Signed)
*  I saw patient in my office earlier today*  Chart review: 24 yo pregnant women presented to [facility] office today after ER visit for left flank pain consistent with kidney stone.  Renal US showed 9 mm left proximal obstructing stone with mild hydronephrosis.  Patient had intractable pain in the office that did not respond to 4mg  morphine.    We discussed management options for the stone including observation, ureteral stent placement and PCN placement for temporizing measures.  Given first trimester, recommend PCN placement and will schedule outpatient ureteroscopy for removal of stone in 2nd trimester.  I discussed this with IR as well and imaging appears amenable to PCN access.   Please contact urology with any questions or concerns.

## 2019-12-14 NOTE — MAU Provider Note (Signed)
History     CSN: [id number]  Arrival date and time: 12/13/19 2230   First Provider Initiated Contact with Patient 12/13/19 2353      Chief Complaint  Patient presents with  . Back Pain   Emily Lowe is a 24 y.o. G1P0 at [redacted]w[redacted]d by LMP who presents to MAU with complaints of back pain and lower abdominal pain. She reports the pain started occurring this past Wednesday. Describes the pain as sharp stabbing pain that starts in left flank area and radiates down to LLQ. Rates pain 10/10- has taken Oxycodone for pain without relief. She reports hx of kidney stones and was diagnosed with kidney stones, started on Flomax and Percocet this morning. Reports taking 1 Flomax and 2 Percocet without relief. She reports blood tinged urine but denies other urinary symptoms. She reports nausea and vomiting started occurring this evening. She denies having a [place] during this pregnancy or an [place] to assess kidney stone.    OB History    Gravida  1   Para      Term      Preterm      AB      Living        SAB      TAB      Ectopic      Multiple      Live Births              Past Medical History:  Diagnosis Date  . Anxiety   . Kidney stones    followed by Dr. Achilles Dunk  . OCD (obsessive compulsive disorder)     Past Surgical History:  Procedure Laterality Date  . WISDOM TOOTH EXTRACTION      Family History  Problem Relation Age of Onset  . Hypertension Mother   . Gout Father     Social History   Tobacco Use  . Smoking status: Never Smoker  . Smokeless tobacco: Never Used  Substance Use Topics  . Alcohol use: Yes    Comment: Occas.  . Drug use: No    Allergies: No Known Allergies  Medications Prior to Admission  Medication Sig Dispense Refill Last Dose  . acetaminophen (TYLENOL) 500 MG tablet Take 500 mg by mouth every 6 (six) hours as needed.   12/13/2019 at Unknown time  . dimenhyDRINATE (DRAMAMINE) 50 MG tablet Take 50 mg by mouth every 8 (eight) hours as needed.    12/13/2019 at Unknown time  . oxyCODONE-acetaminophen (PERCOCET/ROXICET) 5-325 MG tablet Take 1 tablet by mouth every 4 (four) hours as needed for severe pain.     . tamsulosin (FLOMAX) 0.4 MG CAPS capsule Take 0.4 mg by mouth.     . nitrofurantoin, macrocrystal-monohydrate, (MACROBID) 100 MG capsule Take 1 capsule (100 mg total) by mouth 2 (two) times daily. X 7 days 14 capsule 0     Review of Systems  Constitutional: Negative.   Respiratory: Negative.   Cardiovascular: Negative.   Gastrointestinal: Positive for abdominal pain, nausea and vomiting. Negative for constipation and diarrhea.  Genitourinary: Positive for flank pain and hematuria. Negative for difficulty urinating, dysuria, frequency, urgency, vaginal bleeding and vaginal discharge.  Musculoskeletal: Positive for back pain.  Neurological: Negative.    Physical Exam   Blood pressure 110/62, pulse (!) 101, temperature 98.2 F (36.8 C), resp. rate 20, height 5' (1.524 m), weight 59.4 kg, last menstrual period 07/04/2019, SpO2 100 %.  Physical Exam  Nursing note and vitals reviewed. Constitutional: She is oriented to  person, place, and time. She appears well-developed and well-nourished. She appears distressed.  Cardiovascular: Normal rate and regular rhythm.  Respiratory: Effort normal and breath sounds normal. No respiratory distress. She has no wheezes.  GI: Soft. There is abdominal tenderness. There is guarding and CVA tenderness. There is no rebound.  Left CVA tenderness, LLQ tenderness with palpation   Genitourinary:    Genitourinary Comments: Blind swabs obtained    Musculoskeletal:        General: No edema. Normal range of motion.  Neurological: She is alert and oriented to person, place, and time.  Psychiatric: She has a normal mood and affect. Her behavior is normal. Thought content normal.   MAU Course  Procedures  MDM Orders Placed This Encounter  Procedures  . Wet prep, genital  . US Renal  . US OB Comp  Less 14 Wks  . Urinalysis, Routine w reflex microscopic  . CBC  . hCG, quantitative, pregnancy  . Comprehensive metabolic panel  . CBC with Differential/Platelet  . Pregnancy, urine POC  . ABO/Rh   Meds ordered this encounter  Medications  . nalbuphine (NUBAIN) injection 10 mg  . promethazine (PHENERGAN) injection 12.5 mg  . promethazine (PHENERGAN) 12.5 MG tablet    Sig: Take 1 tablet (12.5 mg total) by mouth every 6 (six) hours as needed for nausea or vomiting.    Dispense:  30 tablet    Refill:  0    Order Specific Question:   Supervising Provider    Answer:   Woodroe Mode [[id number]]   Treatments in MAU included Nubain and Phenergan given IM - patient declined IV treatment   Labs and [place] reports reviewed:  Results for orders placed or performed during the hospital encounter of 12/13/19 (from the past 24 hour(s))  Urinalysis, Routine w reflex microscopic     Status: Abnormal   Collection Time: 12/13/19 10:58 PM  Result Value Ref Range   Color, Urine YELLOW YELLOW   APPearance CLOUDY (A) CLEAR   Specific Gravity, Urine 1.026 1.005 - 1.030   pH 8.0 5.0 - 8.0   Glucose, UA NEGATIVE NEGATIVE mg/dL   Hgb urine dipstick LARGE (A) NEGATIVE   Bilirubin Urine NEGATIVE NEGATIVE   Ketones, ur 5 (A) NEGATIVE mg/dL   Protein, ur 100 (A) NEGATIVE mg/dL   Nitrite NEGATIVE NEGATIVE   Leukocytes,Ua MODERATE (A) NEGATIVE   RBC / HPF >50 (H) 0 - 5 RBC/hpf   WBC, UA >50 (H) 0 - 5 WBC/hpf   Bacteria, UA NONE SEEN NONE SEEN   Squamous Epithelial / LPF 0-5 0 - 5   Mucus PRESENT   Pregnancy, urine POC     Status: Abnormal   Collection Time: 12/13/19 11:00 PM  Result Value Ref Range   Preg Test, Ur POSITIVE (A) NEGATIVE  CBC     Status: Abnormal   Collection Time: 12/13/19 11:25 PM  Result Value Ref Range   WBC 15.1 (H) 4.0 - 10.5 K/uL   RBC 4.15 3.87 - 5.11 MIL/uL   Hemoglobin 12.3 12.0 - 15.0 g/dL   HCT 37.2 36.0 - 46.0 %   MCV 89.6 80.0 - 100.0 fL   MCH 29.6 26.0 - 34.0 pg   MCHC  33.1 30.0 - 36.0 g/dL   RDW 11.9 11.5 - 15.5 %   Platelets 275 150 - 400 K/uL   nRBC 0.0 0.0 - 0.2 %  ABO/Rh     Status: None   Collection Time: 12/13/19 11:25 PM  Result Value  Ref Range   ABO/RH(D) B POS    No rh immune globuloin      NOT A RH IMMUNE GLOBULIN CANDIDATE, PT RH POSITIVE Performed at [facility], [address] [place], [place] [address]   hCG, quantitative, pregnancy     Status: Abnormal   Collection Time: 12/13/19 11:25 PM  Result Value Ref Range   hCG, Beta Chain, Quant, S 141,485 (H) <5 mIU/mL  CBC with Differential/Platelet     Status: Abnormal   Collection Time: 12/14/19 12:02 AM  Result Value Ref Range   WBC 15.0 (H) 4.0 - 10.5 K/uL   RBC 4.14 3.87 - 5.11 MIL/uL   Hemoglobin 12.3 12.0 - 15.0 g/dL   HCT 60.437.2 54.036.0 - 98.146.0 %   MCV 89.9 80.0 - 100.0 fL   MCH 29.7 26.0 - 34.0 pg   MCHC 33.1 30.0 - 36.0 g/dL   RDW 19.112.0 47.811.5 - 29.515.5 %   Platelets 284 150 - 400 K/uL   nRBC 0.0 0.0 - 0.2 %   Neutrophils Relative % 85 %   Neutro Abs 12.7 (H) 1.7 - 7.7 K/uL   Lymphocytes Relative 9 %   Lymphs Abs 1.4 0.7 - 4.0 K/uL   Monocytes Relative 5 %   Monocytes Absolute 0.8 0.1 - 1.0 K/uL   Eosinophils Relative 1 %   Eosinophils Absolute 0.1 0.0 - 0.5 K/uL   Basophils Relative 0 %   Basophils Absolute 0.0 0.0 - 0.1 K/uL   Immature Granulocytes 0 %   Abs Immature Granulocytes 0.06 0.00 - 0.07 K/uL  Comprehensive metabolic panel     Status: Abnormal   Collection Time: 12/14/19 12:09 AM  Result Value Ref Range   Sodium 135 135 - 145 mmol/L   Potassium 4.1 3.5 - 5.1 mmol/L   Chloride 101 98 - 111 mmol/L   CO2 24 22 - 32 mmol/L   Glucose, Bld 102 (H) 70 - 99 mg/dL   BUN 10 6 - 20 mg/dL   Creatinine, Ser 6.210.62 0.44 - 1.00 mg/dL   Calcium 9.5 8.9 - 30.810.3 mg/dL   Total Protein 7.1 6.5 - 8.1 g/dL   Albumin 3.8 3.5 - 5.0 g/dL   AST 23 15 - 41 U/L   ALT 20 0 - 44 U/L   Alkaline Phosphatase 34 (L) 38 - 126 U/L   Total Bilirubin 0.7 0.3 - 1.2 mg/dL   GFR calc non Af  Amer >60 >60 mL/min   GFR calc Af Amer >60 >60 mL/min   Anion gap 10 5 - 15  Wet prep, genital     Status: Abnormal   Collection Time: 12/14/19 12:20 AM  Result Value Ref Range   Yeast Wet Prep HPF POC PRESENT (A) NONE SEEN   Trich, Wet Prep NONE SEEN NONE SEEN   Clue Cells Wet Prep HPF POC NONE SEEN NONE SEEN   WBC, Wet Prep HPF POC FEW (A) NONE SEEN   Sperm NONE SEEN    US OB Comp Less 14 Wks  Result Date: 12/14/2019 CLINICAL DATA:  Pregnant patient in first-trimester pregnancy with low back pain. EXAM: OBSTETRIC <14 WK ULTRASOUND TECHNIQUE: Transabdominal ultrasound was performed for evaluation of the gestation as well as the maternal uterus and adnexal regions. COMPARISON:  None. FINDINGS: Intrauterine gestational sac: Single Yolk sac:  Visualized. Embryo:  Visualized. Cardiac Activity: Visualized. Heart Rate: 169 bpm CRL:   30.3 mm   9 w 6 d  [place] EDC: 07/12/2020 Subchorionic hemorrhage:  None visualized. Maternal uterus/adnexae: Both ovaries are visualized and are normal. Corpus luteal cyst in the left ovary. No pelvic free fluid or adnexal mass. IMPRESSION: 1. Single live intrauterine pregnancy estimated gestational age [redacted] weeks 6 days based on crown-rump length for ultrasound [facility] 07/12/2020. No subchorionic hemorrhage. 2. Normal sonographic appearance of the ovaries. Electronically Signed   By: Narda Rutherford M.D.   On: 12/14/2019 01:12   US Renal  Result Date: 12/14/2019 CLINICAL DATA:  Initial evaluation for acute left flank pain. EXAM: RENAL / URINARY TRACT ULTRASOUND COMPLETE COMPARISON:  Prior CT from 01/25/2016. FINDINGS: Right Kidney: Renal measurements: 11.1 x 4.1 x 4.8 cm = volume: 112.5 mL. Echogenicity within normal limits. Two adjacent nonobstructive calculi seen within the interpolar kidney, largest of which measures 9 mm. No hydronephrosis. No discrete renal mass. Left Kidney: Renal measurements: 12.0 x 7.2 x 5.0 cm = volume: 226.4 mL. Echogenicity within normal  limits. No discrete renal mass. There is a 9 x 5 x 8 mm obstructive stone within the proximal left ureter with secondary moderate left hydronephrosis. Additional nonobstructive calculi within the interpolar left kidney measure up to 6 mm. Bladder: Appears normal for degree of bladder distention. Left ureteral jet not visualized. A right jet is seen. Other: None. IMPRESSION: 1. 9 x 5 x 8 mm obstructive stone within the proximal left ureter with secondary moderate left hydronephrosis. 2. Additional bilateral nonobstructive nephrolithiasis as above. Electronically Signed   By: Rise Mu M.D.   On: 12/14/2019 01:49   Consulted with urology on call - Dr Laverle Patter. Discussed assessment and management. Dr Laverle Patter discussed that most likely stent needs to be placed to relive obstruction and that stone this large would most likely not pass on own. Recommends patient to call office in morning to schedule appointment for follow up. Denies need for additional medication at this time.   Discussed plan of care with patient and conversation with Dr Laverle Patter. Number for urology given to patient in AVS and discussed importance of calling to make appointment. Discussed with patient to continue taking Percocet for pain management and Flomax. Rx for Phenergan sent to pharmacy for home use, patient declines zofran as she has difficulty with taste of zofran.   Reviewed OB US results with patient - dating changed based on [place] today. [redacted]w[redacted]d by [place] - EDC 07/12/20. Encouraged to follow up as scheduled for initial prenatal appointment.   Discussed reasons to return to MAU. Make appointment for urology. Return to MAU as needed. Pt stable at time of discharge.   Assessment and Plan   1. Urinary tract obstruction due to kidney stone   2. Low back pain during pregnancy in first trimester   3. Acute left flank pain   4. Lower abdominal pain   5. [redacted] weeks gestation of pregnancy   6. Bilateral nephrolithiasis   7. Kidney stone  complicating pregnancy, first trimester    Discharge home Follow up as scheduled in the office for prenatal care Make appointment for urology for stent placement  Return to MAU as needed for reasons discussed and/or emergencies  Rx for phenergan  Continue Flomax and Percocet as prescribed   Follow-up Information    [facility]. Schedule an appointment as soon as possible for a visit.   Why: Make appointment to be seen in the office for kidney stone management  Contact information: [address] [place] [address] [place] [phone]  Allergies as of 12/14/2019   No Known Allergies     Medication List    STOP taking these medications   dimenhyDRINATE 50 MG tablet Commonly known as: DRAMAMINE   nitrofurantoin (macrocrystal-monohydrate) 100 MG capsule Commonly known as: Macrobid     TAKE these medications   acetaminophen 500 MG tablet Commonly known as: TYLENOL Take 500 mg by mouth every 6 (six) hours as needed.   oxyCODONE-acetaminophen 5-325 MG tablet Commonly known as: PERCOCET/ROXICET Take 1 tablet by mouth every 4 (four) hours as needed for severe pain.   promethazine 12.5 MG tablet Commonly known as: PHENERGAN Take 1 tablet (12.5 mg total) by mouth every 6 (six) hours as needed for nausea or vomiting.   tamsulosin 0.4 MG Caps capsule Commonly known as: FLOMAX Take 0.4 mg by mouth.       Sharyon Cable CNM 12/14/2019, 3:25 AM

## 2019-12-14 NOTE — H&P (Signed)
Chief Complaint: Left hydronephrosos  Referring Physician(s): [facility] [place]  Supervising Physician: Markus Daft  Patient Status: [facility] - In-pt  History of Present Illness: Emily Lowe is a 24 y.o. female who is about [redacted] weeks pregnant with left hydronephrosis secondary to obstructing renal calculi.  She is currently admitted for pain control  She was seen by [facility] and they are unable to safely perform cysto with stent placement until her secondary trimester.  We are asked to place a percutaneous nephrostomy tube.  Past Medical History:  Diagnosis Date  . Anxiety   . Kidney stones    followed by Dr. Jacqlyn Larsen  . OCD (obsessive compulsive disorder)     Past Surgical History:  Procedure Laterality Date  . WISDOM TOOTH EXTRACTION      Allergies: Patient has no known allergies.  Medications: Prior to Admission medications   Medication Sig Start Date End Date Taking? Authorizing Provider  acetaminophen (TYLENOL) 500 MG tablet Take 500 mg by mouth every 6 (six) hours as needed.   Yes [provider]  tamsulosin (FLOMAX) 0.4 MG CAPS capsule Take 0.4 mg by mouth.   Yes [provider]  oxyCODONE-acetaminophen (PERCOCET/ROXICET) 5-325 MG tablet Take 1 tablet by mouth every 4 (four) hours as needed for severe pain.    [provider]  promethazine (PHENERGAN) 12.5 MG tablet Take 1 tablet (12.5 mg total) by mouth every 6 (six) hours as needed for nausea or vomiting. 12/14/19   Lajean Manes, CNM  JUNEL FE 1/20 1-20 MG-MCG tablet TAKE 1 TABLET BY MOUTH ONCE DAILY Patient not taking: Reported on 07/20/2019 08/15/18 07/20/19  Lucille Passy, MD  sertraline (ZOLOFT) 50 MG tablet TAKE 1 TABLET BY MOUTH ONCE DAILY Patient not taking: Reported on 07/20/2019 10/13/18 07/20/19  Lucille Passy, MD     Family History  Problem Relation Age of Onset  . Hypertension Mother   . Gout Father     Social History   Socioeconomic History    . Marital status: Single    Spouse name: Not on file  . Number of children: Not on file  . Years of education: Not on file  . Highest education level: Not on file  Occupational History  . Not on file  Tobacco Use  . Smoking status: Never Smoker  . Smokeless tobacco: Never Used  Substance and Sexual Activity  . Alcohol use: Yes    Comment: Occas.  . Drug use: No  . Sexual activity: Yes    Partners: Male  Other Topics Concern  . Not on file  Social History Narrative   Graduated from high school in 2014.  Going to [employer] in fall.  Physics major.  Thinking about engineering.   Social Determinants of Health   Financial Resource Strain:   . Difficulty of Paying Living Expenses: Not on file  Food Insecurity:   . Worried About Charity fundraiser in the Last Year: Not on file  . Ran Out of Food in the Last Year: Not on file  Transportation Needs:   . Lack of Transportation (Medical): Not on file  . Lack of Transportation (Non-Medical): Not on file  Physical Activity:   . Days of Exercise per Week: Not on file  . Minutes of Exercise per Session: Not on file  Stress:   . Feeling of Stress : Not on file  Social Connections:   . Frequency of Communication with Friends and Family: Not on  file  . Frequency of Social Gatherings with Friends and Family: Not on file  . Attends Religious Services: Not on file  . Active Member of Clubs or Organizations: Not on file  . Attends Banker Meetings: Not on file  . Marital Status: Not on file     Review of Systems: A 12 point ROS discussed and pertinent positives are indicated in the HPI above.  All other systems are negative.  Review of Systems  Vital Signs: BP 108/62   Pulse 89   Temp 98.3 F (36.8 C) (Oral)   Resp 16   Ht 5' (1.524 m)   Wt 59.4 kg   LMP 07/04/2019   SpO2 100% Comment: room air  BMI 25.58 kg/m   Physical Exam Vitals reviewed.  Constitutional:      Appearance: Normal appearance.  HENT:      Head: Normocephalic and atraumatic.  Eyes:     Extraocular Movements: Extraocular movements intact.  Cardiovascular:     Rate and Rhythm: Normal rate and regular rhythm.  Pulmonary:     Effort: Pulmonary effort is normal. No respiratory distress.     Breath sounds: Normal breath sounds.  Abdominal:     General: There is no distension.     Palpations: Abdomen is soft.     Tenderness: There is no abdominal tenderness.  Musculoskeletal:        General: Normal range of motion.     Cervical back: Normal range of motion.  Skin:    General: Skin is warm and dry.  Neurological:     General: No focal deficit present.     Mental Status: She is alert and oriented to person, place, and time.  Psychiatric:        Mood and Affect: Mood normal.        Behavior: Behavior normal.        Thought Content: Thought content normal.        Judgment: Judgment normal.     Imaging: US OB Comp Less 14 Wks  Result Date: 12/14/2019 CLINICAL DATA:  Pregnant patient in first-trimester pregnancy with low back pain. EXAM: OBSTETRIC <14 WK ULTRASOUND TECHNIQUE: Transabdominal ultrasound was performed for evaluation of the gestation as well as the maternal uterus and adnexal regions. COMPARISON:  None. FINDINGS: Intrauterine gestational sac: Single Yolk sac:  Visualized. Embryo:  Visualized. Cardiac Activity: Visualized. Heart Rate: 169 bpm CRL:   30.3 mm   9 w 6 d                  [place] EDC: 07/12/2020 Subchorionic hemorrhage:  None visualized. Maternal uterus/adnexae: Both ovaries are visualized and are normal. Corpus luteal cyst in the left ovary. No pelvic free fluid or adnexal mass. IMPRESSION: 1. Single live intrauterine pregnancy estimated gestational age [redacted] weeks 6 days based on crown-rump length for ultrasound [facility] 07/12/2020. No subchorionic hemorrhage. 2. Normal sonographic appearance of the ovaries. Electronically Signed   By: Narda Rutherford M.D.   On: 12/14/2019 01:12   US Renal  Result Date:  12/14/2019 CLINICAL DATA:  Initial evaluation for acute left flank pain. EXAM: RENAL / URINARY TRACT ULTRASOUND COMPLETE COMPARISON:  Prior CT from 01/25/2016. FINDINGS: Right Kidney: Renal measurements: 11.1 x 4.1 x 4.8 cm = volume: 112.5 mL. Echogenicity within normal limits. Two adjacent nonobstructive calculi seen within the interpolar kidney, largest of which measures 9 mm. No hydronephrosis. No discrete renal mass. Left Kidney: Renal measurements: 12.0 x 7.2 x 5.0 cm =  volume: 226.4 mL. Echogenicity within normal limits. No discrete renal mass. There is a 9 x 5 x 8 mm obstructive stone within the proximal left ureter with secondary moderate left hydronephrosis. Additional nonobstructive calculi within the interpolar left kidney measure up to 6 mm. Bladder: Appears normal for degree of bladder distention. Left ureteral jet not visualized. A right jet is seen. Other: None. IMPRESSION: 1. 9 x 5 x 8 mm obstructive stone within the proximal left ureter with secondary moderate left hydronephrosis. 2. Additional bilateral nonobstructive nephrolithiasis as above. Electronically Signed   By: Rise Mu M.D.   On: 12/14/2019 01:49    Labs:  CBC: Recent Labs    12/13/19 2325 12/14/19 0002  WBC 15.1* 15.0*  HGB 12.3 12.3  HCT 37.2 37.2  PLT 275 284    COAGS: No results for input(s): INR, APTT in the last 8760 hours.  BMP: Recent Labs    12/14/19 0009  NA 135  K 4.1  CL 101  CO2 24  GLUCOSE 102*  BUN 10  CALCIUM 9.5  CREATININE 0.62  GFRNONAA >60  GFRAA >60    LIVER FUNCTION TESTS: Recent Labs    12/14/19 0009  BILITOT 0.7  AST 23  ALT 20  ALKPHOS 34*  PROT 7.1  ALBUMIN 3.8    TUMOR MARKERS: No results for input(s): AFPTM, CEA, CA199, CHROMGRNA in the last 8760 hours.  Assessment and Plan:  Hydronephrosis secondary to obstructing renal calculi.  Will proceed with PCN tomorrow.  NPO after MN.   Spoke with Dr. Ellyn Hack and she Ok'd Versed and Fentanyl for  sedation during pregnancy.  Risks and benefits of left PCN placement was discussed with the patient including, but not limited to, infection, bleeding, significant bleeding causing loss or decrease in renal function or damage to adjacent structures.   All of the patient's questions were answered, patient is agreeable to proceed.  Consent signed and in chart.  Thank you for this interesting consult.  I greatly enjoyed meeting SOLIMAR MAIDEN and look forward to participating in their care.  A copy of this report was sent to the requesting provider on this date.  Electronically Signed: Gwynneth Macleod, PA-C   12/14/2019, 4:51 PM      I spent a total of 40 Minutes in face to face in clinical consultation, greater than 50% of which was counseling/coordinating care for left PCN.

## 2019-12-14 NOTE — MAU Note (Signed)
covid swab collected

## 2019-12-15 ENCOUNTER — Observation Stay (HOSPITAL_COMMUNITY): Payer: Commercial Managed Care - PPO

## 2019-12-15 HISTORY — PX: IR NEPHROSTOMY PLACEMENT LEFT: IMG6063

## 2019-12-15 LAB — PROTIME-INR
INR: 0.9 (ref 0.8–1.2)
Prothrombin Time: 12.2 seconds (ref 11.4–15.2)

## 2019-12-15 MED ORDER — MIDAZOLAM HCL 2 MG/2ML IJ SOLN
INTRAMUSCULAR | Status: DC | PRN
  Administered 2019-12-15 (×7): 0.5 mg via INTRAVENOUS

## 2019-12-15 MED ORDER — MIDAZOLAM HCL 2 MG/2ML IJ SOLN
INTRAMUSCULAR | Status: AC
  Filled 2019-12-15: qty 2

## 2019-12-15 MED ORDER — LIDOCAINE HCL 1 % IJ SOLN
INTRAMUSCULAR | Status: AC
  Filled 2019-12-15: qty 20

## 2019-12-15 MED ORDER — FENTANYL CITRATE (PF) 100 MCG/2ML IJ SOLN
INTRAMUSCULAR | Status: AC
  Filled 2019-12-15: qty 2

## 2019-12-15 MED ORDER — SODIUM CHLORIDE 0.9% FLUSH
5.0000 mL | Freq: Three times a day (TID) | INTRAVENOUS | Status: DC
  Administered 2019-12-15 – 2019-12-16 (×2): 5 mL

## 2019-12-15 MED ORDER — LIDOCAINE HCL (PF) 1 % IJ SOLN
INTRAMUSCULAR | Status: DC | PRN
  Administered 2019-12-15: 10 mL

## 2019-12-15 MED ORDER — CEFAZOLIN SODIUM-DEXTROSE 2-4 GM/100ML-% IV SOLN
2.0000 g | Freq: Once | INTRAVENOUS | Status: AC
  Filled 2019-12-15: qty 100

## 2019-12-15 MED ORDER — CEPHALEXIN 250 MG PO CAPS
250.0000 mg | ORAL_CAPSULE | Freq: Every day | ORAL | Status: DC
  Administered 2019-12-15 – 2019-12-16 (×2): 250 mg via ORAL
  Filled 2019-12-15 (×3): qty 1

## 2019-12-15 MED ORDER — HYDROMORPHONE HCL 1 MG/ML IJ SOLN
0.5000 mg | INTRAMUSCULAR | Status: DC | PRN
  Administered 2019-12-15 (×3): 0.5 mg via INTRAVENOUS
  Filled 2019-12-15 (×3): qty 1

## 2019-12-15 MED ORDER — OXYCODONE HCL 5 MG PO TABS
5.0000 mg | ORAL_TABLET | ORAL | Status: DC | PRN
  Administered 2019-12-15 (×2): 5 mg via ORAL
  Filled 2019-12-15 (×2): qty 1

## 2019-12-15 MED ORDER — CEFAZOLIN SODIUM-DEXTROSE 2-4 GM/100ML-% IV SOLN
INTRAVENOUS | Status: AC
  Administered 2019-12-15: 12:00:00 2 g via INTRAVENOUS
  Filled 2019-12-15: qty 100

## 2019-12-15 MED ORDER — IOHEXOL 300 MG/ML  SOLN
50.0000 mL | Freq: Once | INTRAMUSCULAR | Status: AC | PRN
  Administered 2019-12-15: 15 mL

## 2019-12-15 MED ORDER — FENTANYL CITRATE (PF) 100 MCG/2ML IJ SOLN
INTRAMUSCULAR | Status: DC | PRN
  Administered 2019-12-15: 25 ug via INTRAVENOUS
  Administered 2019-12-15: 50 ug via INTRAVENOUS
  Administered 2019-12-15: 25 ug via INTRAVENOUS

## 2019-12-15 NOTE — Discharge Summary (Signed)
Physician Discharge Summary  Patient ID: KYNSLEY WHITEHOUSE MRN: [id number] DOB/AGE: 03-09-1996 24 y.o.  Admit date: 12/14/2019 Discharge date: 12/26/2019  Admission Diagnoses: Obstructing nephrolithiasis in first trimester  Discharge Diagnoses:  Active Problems:   Pregnancy complicated by nephrolithiasis in first trimester, antepartum s/p left  percutaneous nephrostomy placement  Discharged Condition: good  Hospital Course: Pt was admitted for pain control for obstructing nephrolithiasis of 17mm stone in left ureter.  She had a percutaneous nephrostomy placed 12/15/19 and was transitioned back to po pain medications and a regular diet the night after placed. The following morning she was tolerating po intake, ambulating, and pain controlled.  She was instructed on care of her nephrostomy drain prior to discharge.    Consults: urology and interventional radiology  Significant Diagnostic Studies: labs, [place] on 12/14/19 with 9 week 6 day  Treatments: IR PCN placement  Discharge Exam: Blood pressure 104/63, pulse 83, temperature 99 F (37.2 C), temperature source Oral, resp. rate 16, height 5' (1.524 m), weight 59.4 kg, last menstrual period 07/04/2019, SpO2 100 %. General appearance: alert and cooperative GI: abnormal findings:  soft NT and no distention Incision/Wound:  Dressing clear   Disposition:  Discharge disposition: 01-Home or Self Care      Discharge Instructions     Call MD for:  difficulty breathing, headache or visual disturbances   Complete by: As directed    Call MD for:  persistant dizziness or light-headedness   Complete by: As directed    Call MD for:  persistant nausea and vomiting   Complete by: As directed    Call MD for:  redness, tenderness, or signs of infection (pain, swelling, redness, odor or green/yellow discharge around incision site)   Complete by: As directed    Call MD for:  severe uncontrolled pain   Complete by: As directed    Call MD for:   temperature >100.4   Complete by: As directed    Diet - low sodium heart healthy   Complete by: As directed    Discharge instructions   Complete by: As directed    Call with any concerns [phone]   Increase activity slowly   Complete by: As directed       Allergies as of 12/16/2019   No Known Allergies      Medication List     STOP taking these medications    oxyCODONE 5 MG immediate release tablet Commonly known as: Oxy IR/ROXICODONE       TAKE these medications    acetaminophen 500 MG tablet Commonly known as: TYLENOL Take 500 mg by mouth every 6 (six) hours as needed.   cephALEXin 250 MG capsule Commonly known as: KEFLEX Take 1 capsule (250 mg total) by mouth daily.   dimenhyDRINATE 50 MG tablet Commonly known as: DRAMAMINE Take 50 mg by mouth every 8 (eight) hours as needed for nausea.   doxylamine (Sleep) 25 MG tablet Commonly known as: UNISOM Take 25 mg by mouth at bedtime as needed.   prenatal multivitamin Tabs tablet Take 1 tablet by mouth daily at 12 noon.   promethazine 12.5 MG tablet Commonly known as: PHENERGAN Take 1 tablet (12.5 mg total) by mouth every 6 (six) hours as needed for nausea or vomiting.   simethicone 125 MG chewable tablet Commonly known as: MYLICON Chew 109 mg by mouth every 6 (six) hours as needed for flatulence.   tamsulosin 0.4 MG Caps capsule Commonly known as: FLOMAX Take 0.4 mg by mouth.  vitamin B-6 25 MG tablet Commonly known as: pyridOXINE Take 25 mg by mouth daily as needed (nausea).   zolpidem 5 MG tablet Commonly known as: AMBIEN Take 1 tablet (5 mg total) by mouth at bedtime as needed for sleep.       ASK your doctor about these medications    HYDROmorphone 2 MG tablet Commonly known as: Dilaudid Take 1 tablet (2 mg total) by mouth every 4 (four) hours as needed for up to 7 days for severe pain. Ask about: Should I take this medication?       Follow-up Information     Meisinger, Todd, MD  Follow up in 6 day(s).   Specialty: Obstetrics and Gynecology Why: Keep appointment 12/22/19 as planned Contact information: [address], [address] [place] [address] [phone]         Noel Christmas, MD. Schedule an appointment as soon as possible for a visit.   Specialty: Urology Contact information: [address] [place] [address] [phone]         Dianne Dun, MD Follow up.   Specialty: Family Medicine Why: Wednesday  12/23/19 at 9:00 am- THIS IS A MY CHART VIDEO VISIT -VIRTUAL VISIT.- DO NOT GO INTO THE OFFICE. CALL OFFICE  IF YOU HAVE ANY QUESTIONS.  Contact information: [address] [place] [address] [phone]            Signed: Oliver Pila 12/26/2019, 12:08 PM

## 2019-12-15 NOTE — Procedures (Signed)
  Procedure: Left 87f D-M nephrostomy catheter placement   EBL:   minimal Complications:  none immediate  See full dictation in [employer].  Thora Lance MD Main # [phone] Pager  [phone]

## 2019-12-15 NOTE — Progress Notes (Signed)
Patient ID: Emily Lowe, female   DOB: June 18, 1996, 24 y.o.   MRN: [id number]  s/p PCN placement by IR and just returned to floor about 4pm  Pt sitting up in chair.  Pt states pain is controlled s/p IV dilaudid.  Just now had first meal and is having no nausea. Has not taken any po pain medications yet, and needs to be instructed on care of nephrostomy drain.  afeb VSS  Abdomen soft NT  S/p PCN placement with planned outpatient ureteroscopy for removal of stone in 2nd trimester with urology. Saline lock IV Transition to po meds and ambulate this PM Pt and partner to be instructed on drain care. Plan for d/c tomorrow AM. Pt has OB w/u with [place] scheduled in our office for 12/22/19 Will need f/u appointment scheduled with urology in next 2-3 weeks to schedule ureteroscopy

## 2019-12-15 NOTE — Progress Notes (Signed)
Patient ID: [name], female   DOB: 30-Jun-1996, 24 y.o.   MRN: [id number] HD #2 obstructing renal caculi [redacted] weeks gestation  Per partner pt had relatively good night.  Tolerated po dinner and has been NPO since midnight Needed IV pain med x 1  afeb VSS  Pt currently at IR for PCN placement.  Will assess this PM for possible d/c home if tolerating po meds.

## 2019-12-16 MED ORDER — HYDROMORPHONE HCL 2 MG PO TABS: 2.0000 mg | ORAL_TABLET | ORAL | 0 refills | Status: AC | PRN

## 2019-12-16 MED ORDER — ZOLPIDEM TARTRATE 5 MG PO TABS: 5.0000 mg | ORAL_TABLET | Freq: Every evening | ORAL | 0 refills | Status: DC | PRN

## 2019-12-16 MED ORDER — CEPHALEXIN 250 MG PO CAPS: 250.0000 mg | ORAL_CAPSULE | Freq: Every day | ORAL | 0 refills | Status: DC

## 2019-12-16 NOTE — Care Management Note (Signed)
Case Management Note  Patient Details  Name: Emily Lowe MRN: [id number] Date of Birth: 09-30-1996  Subjective/Objective:                   A/P: 23yo prime at 10 1/7wks with obstructing kidney stone s/p nephrostomy tube placement -     Expected Discharge Date:  12/16/19               Expected Discharge Plan:  Home/Self Care  In-House Referral:  PCP   Status of Service:   completed   Additional Comments: CM had consult for PCP needs. CM spoke to patient and patient informed CM that she has a PCP Ruthe Mannan MD [facility] ) and would like to keep the physician. CM called the physician's office and verified that patient is active with the office and made a follow up appointment for patient on Wednesday 12/22/18 at 9:00 - this is a virtual visit. All information explained to patient and patient verbalized and in agreement with plan.     Gretchen Short RNC-MNN, BSN [facility]  12/16/2019, 11:05 AM

## 2019-12-16 NOTE — Discharge Instructions (Addendum)
Nephrostomy Tube Care: Care Instructions  Location of kidneys and bladder in body, showing urine bag outside body and detail of tube draining urine from kidney Your Care Instructions A nephrostomy tube is a thin catheter placed into your kidney to drain urine. You may have one tube in a kidney or two tubes, one in each kidney. The urine collects in a bag attached to the tube. In most cases, the bag is attached to your leg. Sometimes the catheter tube has a valve that lets you drain the urine into the toilet or other container.  You may need a nephrostomy tube if you have a blockage or a hole in your urinary tract. The blockage may be caused by a kidney stone, infection, scar tissue, or a tumour.  If you have only one tube, you still need to urinate. Your other kidney will still produce urine that will drain into your bladder.  Having a nephrostomy tube in for a long time increases the risk of getting an infection. Nephrostomy tube care focuses on preventing infection.  Follow-up care is a key part of your treatment and safety. Be sure to make and go to all appointments, and call your doctor or nurse call line if you are having problems. It's also a good idea to know your test results and keep a list of the medicines you take.  How can you care for yourself at home? Follow the instructions below if your healthcare provider has told you that it's OK to clean your nephrostomy bag at home and you don't have a weakened immune system.  Wash your hands before you handle the nephrostomy tube. Clean the area around the tube with soap and water every day. Keep the drainage bag lower than your kidney to keep urine from backing up. You can clean the bag after removing it from the tube. Use another container to collect your urine while you clean the bag. To clean the bag, fill it with 2 parts vinegar to 3 parts water, and let it stand for 20 minutes. Then empty it out, and let it air dry. Empty the drainage  bag before it is completely full or every 2 to 3 hours. Do not swim or take baths while you have a nephrostomy tube. You can shower after wrapping the end of the nephrostomy tube with plastic wrap. Change the dressing around the nephrostomy tube about every 3 days or when it gets wet or dirty. A nurse will teach you how to change the dressing. When should you call for help?   Call your doctor or nurse call line now or seek immediate medical care if:  You have new or worse symptoms of a kidney infection. These may include: Pain or burning when you urinate. A frequent need to urinate without being able to pass much urine. Pain in the flank, which is just below the rib cage and above the waist on either side of the back. Blood in the urine. a fever. You are vomiting or nauseated. Your tube leaks. Urine does not collect in the drainage bag. You have pain or bleeding around the tube. Watch closely for changes in your health, and be sure to contact your doctor or nurse call line if:  You do not get better as expected.

## 2019-12-16 NOTE — Progress Notes (Signed)
Patient ID: Emily Lowe, female   DOB: 12-May-1996, 24 y.o.   MRN: [id number] HD#3 Pt reports sleep aid helped last night - feels well rested. Pain well controlled with oral medication. Tolerating diet. Has some apprehension about remembering to care for percutaneous drain - husband at bedside and readily offers help ( works from home); they are able to call pts mother for more help if needed. She denies fever, chills, nausea or abnormal vaginal discharge VSS GEN - NAD ABD - guarded, soft, flat, nontender EXT - no edema  Drain - in place  A/P: 23yo prime at 10 1/7wks with obstructing kidney stone s/p nephrostomy tube placement - stable         Discharge instructions reviewed: Keep work up appt on 12/22/19 at [facility]                                                               F/U Urology in 2-3 weeks - make appt                                                               Rx sent for keflex, tylenol and dilaudid

## 2019-12-16 NOTE — Progress Notes (Signed)
Talked with IR and NURSE for [facility]  Urology   About pts care and follow up  With NEPHOSTEMY TUBE     TUBE CARE  INSTRUCTIONS given to pt  And pt is to schdule   Follow up appointment   Copy of d/c instructions given to pt  And husband

## 2019-12-16 NOTE — Plan of Care (Signed)
  Problem: Education: Goal: Knowledge of disease or condition will improve Outcome: Completed/Met Goal: Knowledge of the prescribed therapeutic regimen will improve Outcome: Completed/Met   Problem: Clinical Measurements: Goal: Respiratory complications will improve Outcome: Not Applicable

## 2019-12-16 NOTE — Plan of Care (Signed)
Pt feels comfortable  with instructions and spouse

## 2019-12-17 ENCOUNTER — Telehealth: Payer: Self-pay | Admitting: *Deleted

## 2019-12-17 NOTE — Telephone Encounter (Signed)
Unable to reach pt. First attempt. Pt does have hospital follow up scheduled w PCP 12/23/19

## 2019-12-18 NOTE — Telephone Encounter (Signed)
Unable to reach pt. 2nd attempt. appt scheduled 12/23/19.

## 2019-12-21 DIAGNOSIS — Z09 Encounter for follow-up examination after completed treatment for conditions other than malignant neoplasm: Secondary | ICD-10-CM | POA: Insufficient documentation

## 2019-12-21 NOTE — Assessment & Plan Note (Addendum)
Pt was admitted for pain control for obstructing nephrolithiasis of 84mm stone in left ureter.  She had a percutaneous nephrostomy placed 12/15/19 and was transitioned back to po pain medications and a regular diet the night after placed. The following morning she was tolerating po intake, ambulating, and pain controlled.   Consults: urology and interventional radiology  Significant Diagnostic Studies: labs, [place] on 12/14/19 with 9 week 6 day   Followed by urology.  Lithotripsy planned at [redacted] weeks gestation.

## 2019-12-21 NOTE — Progress Notes (Signed)
Virtual Visit via Video   Due to the COVID-19 pandemic, this visit was completed with telemedicine (audio/video) technology to reduce patient and provider exposure as well as to preserve personal protective equipment.   I connected with Emily Lowe by a video enabled telemedicine application and verified that I am speaking with the correct person using two identifiers. Location patient: Home Location provider: [facility], Office Persons participating in the virtual visit: Emily K Buckner [place], MD   I discussed the limitations of evaluation and management by telemedicine and the availability of in person appointments. The patient expressed understanding and agreed to proceed.  Care Team   Patient Care Team: Emily Dun, MD as PCP - General (Family Medicine) Noel Christmas, MD as Consulting Physician (Urology)  Subjective:   HPI: Patient is connecting today for a hospital F/U. She was at [facility] on 1.18.21-1.20.21 for "Pregnancy complicated by nephrolithiasis in first trimester, antepartum." On renal U/S impression  9x5x52mm obstructive stone w/i the proximal left ureter with secondary moderate left hydronephrosis. Additional nonobstructive stone w/in the interpolar left kidney up to 87mm. Also Two adjacent nonobstructive stones w/i the interpolar kidney righ sided largest measuring 46mm. She had a left percutaneous nephrostpmy catheter placement. She is now 11-weeks-gestation. She states that she is doing much better and is not having any kidney pain but states that the tube is annoying and itchy.   Phone [phone]   Subjective:  Emily Lowe is a 24 y.o. year old very pleasant female patient who connects via virtual visit for transitional care management and hospital follow up for pregnancy complicated by nephrolithiasis in first trimester, s/p left percutaneous nephrostomy placement. Patient was hospitalized from 12/14/19 to 12/15/19. A TCM phone call was completed on  12/17/19.    See problem oriented charting as well     Review of Systems  Constitutional: Negative for fever and malaise/fatigue.  HENT: Negative for congestion and hearing loss.   Eyes: Negative for blurred vision, discharge and redness.  Respiratory: Negative for cough and shortness of breath.   Cardiovascular: Negative for chest pain, palpitations and leg swelling.  Gastrointestinal: Negative for abdominal pain, heartburn, nausea and vomiting.  Genitourinary: Negative for dysuria, flank pain, frequency, hematuria and urgency.  Musculoskeletal: Negative for falls.  Skin: Negative for rash.  Neurological: Negative for loss of consciousness and headaches.  Endo/Heme/Allergies: Does not bruise/bleed easily.  Psychiatric/Behavioral: Negative for depression.  All other systems reviewed and are negative.    Patient Active Problem List   Diagnosis Date Noted  . Hospital discharge follow-up 12/21/2019  . Pregnancy complicated by nephrolithiasis in first trimester, antepartum 12/14/2019  . Anxiety and depression 11/18/2018  . Hearing loss due to cerumen impaction, left 11/18/2018  . Contraception management 09/03/2016  . OCD (obsessive compulsive disorder) 11/10/2015  . Adjustment disorder with anxiety 02/08/2015  . Kidney stones     Social History   Tobacco Use  . Smoking status: Never Smoker  . Smokeless tobacco: Never Used  Substance Use Topics  . Alcohol use: Yes    Comment: Occas.    Current Outpatient Medications:  .  acetaminophen (TYLENOL) 500 MG tablet, Take 500 mg by mouth every 6 (six) hours as needed., Disp: , Rfl:  .  cephALEXin (KEFLEX) 250 MG capsule, Take 1 capsule (250 mg total) by mouth daily., Disp: 30 capsule, Rfl: 0 .  dimenhyDRINATE (DRAMAMINE) 50 MG tablet, Take 50 mg by mouth every 8 (eight) hours as needed for nausea., Disp: ,  Rfl:  .  doxylamine, Sleep, (UNISOM) 25 MG tablet, Take 25 mg by mouth at bedtime as needed., Disp: , Rfl:  .  HYDROmorphone  (DILAUDID) 2 MG tablet, Take 1 tablet (2 mg total) by mouth every 4 (four) hours as needed for up to 7 days for severe pain., Disp: 30 tablet, Rfl: 0 .  Prenatal Vit-Fe Fumarate-FA (PRENATAL MULTIVITAMIN) TABS tablet, Take 1 tablet by mouth daily at 12 noon., Disp: , Rfl:  .  promethazine (PHENERGAN) 12.5 MG tablet, Take 1 tablet (12.5 mg total) by mouth every 6 (six) hours as needed for nausea or vomiting., Disp: 30 tablet, Rfl: 0 .  simethicone (MYLICON) 086 MG chewable tablet, Chew 125 mg by mouth every 6 (six) hours as needed for flatulence., Disp: , Rfl:  .  tamsulosin (FLOMAX) 0.4 MG CAPS capsule, Take 0.4 mg by mouth., Disp: , Rfl:  .  vitamin B-6 (PYRIDOXINE) 25 MG tablet, Take 25 mg by mouth daily as needed (nausea)., Disp: , Rfl:  .  zolpidem (AMBIEN) 5 MG tablet, Take 1 tablet (5 mg total) by mouth at bedtime as needed for sleep., Disp: 10 tablet, Rfl: 0  No Known Allergies  Objective:  LMP 07/04/2019  VITALS: Per patient if applicable, see vitals. GENERAL: Alert, appears well and in no acute distress. HEENT: Atraumatic, conjunctiva clear, no obvious abnormalities on inspection of external nose and ears. NECK: Normal movements of the head and neck. CARDIOPULMONARY: No increased WOB. Speaking in clear sentences. I:E ratio WNL.  MS: Moves all visible extremities without noticeable abnormality. PSYCH: Pleasant and cooperative, well-groomed. Speech normal rate and rhythm. Affect is appropriate. Insight and judgement are appropriate. Attention is focused, linear, and appropriate.  NEURO: CN grossly intact. Oriented as arrived to appointment on time with no prompting. Moves both UE equally.  SKIN: No obvious lesions, wounds, erythema, or cyanosis noted on face or hands.  Depression screen [facility] 2/9 11/18/2018 08/22/2017  Decreased Interest 1 0  Down, Depressed, Hopeless 1 0  PHQ - 2 Score 2 0  Altered sleeping 2 -  Tired, decreased energy 1 -  Change in appetite 1 -  Feeling bad or  failure about yourself  1 -  Trouble concentrating 2 -  Moving slowly or fidgety/restless 2 -  Suicidal thoughts 1 -  PHQ-9 Score 12 -  Difficult doing work/chores Somewhat difficult -     . COVID-19 Education: The signs and symptoms of COVID-19 were discussed with the patient and how to seek care for testing if needed. The importance of social distancing was discussed today. . Reviewed expectations re: course of current medical issues. . Discussed self-management of symptoms. . Outlined signs and symptoms indicating need for more acute intervention. . Patient verbalized understanding and all questions were answered. Marland Kitchen Health Maintenance issues including appropriate healthy diet, exercise, and smoking avoidance were discussed with patient. . See orders for this visit as documented in the electronic medical record.  Arnette Norris, MD  Records requested if needed. Time spent: 75minutes, of which >50% was spent in obtaining information about her symptoms, reviewing her previous labs, evaluations, and treatments, counseling her about her condition (please see the discussed topics above), and developing a plan to further investigate it; she had a number of questions which I addressed.   Lab Results  Component Value Date   WBC 15.0 (H) 12/14/2019   HGB 12.3 12/14/2019   HCT 37.2 12/14/2019   PLT 284 12/14/2019   GLUCOSE 102 (H) 12/14/2019   CHOL  164 09/24/2017   TRIG 103.0 09/24/2017   HDL 45.60 09/24/2017   LDLCALC 98 09/24/2017   ALT 20 12/14/2019   AST 23 12/14/2019   NA 135 12/14/2019   K 4.1 12/14/2019   CL 101 12/14/2019   CREATININE 0.62 12/14/2019   BUN 10 12/14/2019   CO2 24 12/14/2019   TSH 0.78 09/24/2017   INR 0.9 12/15/2019    Lab Results  Component Value Date   TSH 0.78 09/24/2017   Lab Results  Component Value Date   WBC 15.0 (H) 12/14/2019   HGB 12.3 12/14/2019   HCT 37.2 12/14/2019   MCV 89.9 12/14/2019   PLT 284 12/14/2019   Lab Results  Component  Value Date   NA 135 12/14/2019   K 4.1 12/14/2019   CO2 24 12/14/2019   GLUCOSE 102 (H) 12/14/2019   BUN 10 12/14/2019   CREATININE 0.62 12/14/2019   BILITOT 0.7 12/14/2019   ALKPHOS 34 (L) 12/14/2019   AST 23 12/14/2019   ALT 20 12/14/2019   PROT 7.1 12/14/2019   ALBUMIN 3.8 12/14/2019   CALCIUM 9.5 12/14/2019   ANIONGAP 10 12/14/2019   GFR 127.23 09/24/2017   Lab Results  Component Value Date   CHOL 164 09/24/2017   Lab Results  Component Value Date   HDL 45.60 09/24/2017   Lab Results  Component Value Date   LDLCALC 98 09/24/2017   Lab Results  Component Value Date   TRIG 103.0 09/24/2017   Lab Results  Component Value Date   CHOLHDL 4 09/24/2017   No results found for: HGBA1C     Assessment & Plan:   Problem List Items Addressed This Visit      Active Problems   Anxiety and depression    She is asking to be referred to a therapist again.  She feels a little overwhelmed, wants to deal with her OCD now with therapy.  Has not been back but does not it to be worse again.  Depression screen [facility] 2/9 11/18/2018 08/22/2017  Decreased Interest 1 0  Down, Depressed, Hopeless 1 0  PHQ - 2 Score 2 0  Altered sleeping 2 -  Tired, decreased energy 1 -  Change in appetite 1 -  Feeling bad or failure about yourself  1 -  Trouble concentrating 2 -  Moving slowly or fidgety/restless 2 -  Suicidal thoughts 1 -  PHQ-9 Score 12 -  Difficult doing work/chores Somewhat difficult -         Pregnancy complicated by nephrolithiasis in first trimester, antepartum - Primary    Following by Dr. Arita Miss who plans on lithotripsy when she 14 weeks pregnancy.  She is itchy where the tube is.  No pain, no nausea.       Hospital discharge follow-up     Pt was admitted for pain control for obstructing nephrolithiasis of 57mm stone in left ureter.  She had a percutaneous nephrostomy placed 12/15/19 and was transitioned back to po pain medications and a regular diet the night after  placed. The following morning she was tolerating po intake, ambulating, and pain controlled.   Consults: urology and interventional radiology  Significant Diagnostic Studies: labs, [place] on 12/14/19 with 9 week 6 day   ? Urology follow up         I am having Juanna K. Skarda maintain her tamsulosin, acetaminophen, promethazine, prenatal multivitamin, dimenhyDRINATE, doxylamine (Sleep), vitamin B-6, simethicone, cephALEXin, zolpidem, and HYDROmorphone.  No orders of the defined types were placed  in this encounter.    Ruthe Mannan, MD

## 2019-12-22 ENCOUNTER — Telehealth: Payer: Commercial Managed Care - PPO | Admitting: Family Medicine

## 2019-12-23 ENCOUNTER — Telehealth (INDEPENDENT_AMBULATORY_CARE_PROVIDER_SITE_OTHER): Payer: Commercial Managed Care - PPO | Admitting: Family Medicine

## 2019-12-23 ENCOUNTER — Encounter: Payer: Self-pay | Admitting: Family Medicine

## 2019-12-23 DIAGNOSIS — F329 Major depressive disorder, single episode, unspecified: Secondary | ICD-10-CM

## 2019-12-23 DIAGNOSIS — F419 Anxiety disorder, unspecified: Secondary | ICD-10-CM

## 2019-12-23 DIAGNOSIS — O26831 Pregnancy related renal disease, first trimester: Secondary | ICD-10-CM

## 2019-12-23 DIAGNOSIS — N200 Calculus of kidney: Secondary | ICD-10-CM

## 2019-12-23 DIAGNOSIS — Z09 Encounter for follow-up examination after completed treatment for conditions other than malignant neoplasm: Secondary | ICD-10-CM | POA: Diagnosis not present

## 2019-12-23 NOTE — Assessment & Plan Note (Signed)
She is asking to be referred to a therapist again.  She feels a little overwhelmed, wants to deal with her OCD now with therapy.  Has not been back but does not it to be worse again.  Depression screen [facility] 2/9 11/18/2018 08/22/2017  Decreased Interest 1 0  Down, Depressed, Hopeless 1 0  PHQ - 2 Score 2 0  Altered sleeping 2 -  Tired, decreased energy 1 -  Change in appetite 1 -  Feeling bad or failure about yourself  1 -  Trouble concentrating 2 -  Moving slowly or fidgety/restless 2 -  Suicidal thoughts 1 -  PHQ-9 Score 12 -  Difficult doing work/chores Somewhat difficult -

## 2019-12-23 NOTE — Assessment & Plan Note (Signed)
Following by Dr. Arita Miss who plans on lithotripsy when she 14 weeks pregnancy.  She is itchy where the tube is.  No pain, no nausea.

## 2020-01-06 ENCOUNTER — Other Ambulatory Visit: Payer: Self-pay | Admitting: Urology

## 2020-01-20 NOTE — Patient Instructions (Addendum)
DUE TO COVID-19 ONLY ONE VISITOR IS ALLOWED TO COME WITH YOU AND STAY IN THE WAITING ROOM ONLY DURING PRE OP AND PROCEDURE DAY OF SURGERY. THE 1 VISITOR MAY VISIT WITH YOU AFTER SURGERY IN YOUR PRIVATE ROOM DURING VISITING HOURS ONLY!  YOU NEED TO HAVE A COVID 19 TEST ON 01-22-20 @ 2:45 PM, THIS TEST MUST BE DONE BEFORE SURGERY, COME  [address], [place] , [address].  [facility]) ONCE YOUR COVID TEST IS COMPLETED, PLEASE BEGIN THE QUARANTINE INSTRUCTIONS AS OUTLINED IN YOUR HANDOUT.                Emily Lowe  01/20/2020   Your procedure is scheduled on: 01-26-20   Report to [facility] Main  Entrance    Report to Admitting at 8:45 AM     Call this number if you have problems the morning of surgery [phone]    Remember: Do not eat food or drink liquids :After Midnight.     Take these medicines the morning of surgery with A SIP OF WATER: None  BRUSH YOUR TEETH MORNING OF SURGERY AND RINSE YOUR MOUTH OUT, NO CHEWING GUM CANDY OR MINTS.                                 You may not have any metal on your body including hair pins and              piercings    Do not wear jewelry, make-up, lotions, powders or perfumes, deodorant         No fingernail on your fingernails. You can't shave 48 hours prior to surgery   Do not bring valuables to the hospital. [facility] IS NOT             RESPONSIBLE   FOR VALUABLES.  Contacts, dentures or bridgework may not be worn into surgery.       Patients discharged the day of surgery will not be allowed to drive home. IF YOU ARE HAVING SURGERY AND GOING HOME THE SAME DAY, YOU MUST HAVE AN ADULT TO DRIVE YOU HOME AND BE WITH YOU FOR 24 HOURS. YOU MAY GO HOME BY TAXI OR UBER OR ORTHERWISE, BUT AN ADULT MUST ACCOMPANY YOU HOME AND STAY WITH YOU FOR 24 HOURS.  Name and phone number of your driver: Orie Fisherman [phone]  Special Instructions: N/A              Please read over the following fact sheets you  were given: _____________________________________________________________________             [facility] - Preparing for Surgery Before surgery, you can play an important role.  Because skin is not sterile, your skin needs to be as free of germs as possible.  You can reduce the number of germs on your skin by washing with CHG (chlorahexidine gluconate) soap before surgery.  CHG is an antiseptic cleaner which kills germs and bonds with the skin to continue killing germs even after washing. Please DO NOT use if you have an allergy to CHG or antibacterial soaps.  If your skin becomes reddened/irritated stop using the CHG and inform your nurse when you arrive at Short Stay. Do not shave (including legs and underarms) for at least 48 hours prior to the first CHG shower.  You may shave your face/neck. Please follow these instructions carefully:  1.  Shower with CHG Soap the night before surgery and the  morning of Surgery.  2.  If you choose to wash your hair, wash your hair first as usual with your  normal  shampoo.  3.  After you shampoo, rinse your hair and body thoroughly to remove the  shampoo.                           4.  Use CHG as you would any other liquid soap.  You can apply chg directly  to the skin and wash                       Gently with a scrungie or clean washcloth.  5.  Apply the CHG Soap to your body ONLY FROM THE NECK DOWN.   Do not use on face/ open                           Wound or open sores. Avoid contact with eyes, ears mouth and genitals (private parts).                       Wash face,  Genitals (private parts) with your normal soap.             6.  Wash thoroughly, paying special attention to the area where your surgery  will be performed.  7.  Thoroughly rinse your body with warm water from the neck down.  8.  DO NOT shower/wash with your normal soap after using and rinsing off  the CHG Soap.                9.  Pat yourself dry with a clean towel.            10.  Wear  clean pajamas.            11.  Place clean sheets on your bed the night of your first shower and do not  sleep with pets. Day of Surgery : Do not apply any lotions/deodorants the morning of surgery.  Please wear clean clothes to the hospital/surgery center.  FAILURE TO FOLLOW THESE INSTRUCTIONS MAY RESULT IN THE CANCELLATION OF YOUR SURGERY PATIENT SIGNATURE_________________________________  NURSE SIGNATURE__________________________________  ________________________________________________________________________

## 2020-01-21 ENCOUNTER — Encounter (HOSPITAL_COMMUNITY)
Admission: RE | Admit: 2020-01-21 | Discharge: 2020-01-21 | Disposition: A | Discharge status: Home / Self Care | Payer: Commercial Managed Care - PPO | Source: Ambulatory Visit | Attending: Urology | Admitting: Urology

## 2020-01-21 ENCOUNTER — Encounter (HOSPITAL_COMMUNITY): Payer: Self-pay

## 2020-01-21 ENCOUNTER — Other Ambulatory Visit: Payer: Self-pay

## 2020-01-21 DIAGNOSIS — Z01812 Encounter for preprocedural laboratory examination: Secondary | ICD-10-CM | POA: Diagnosis not present

## 2020-01-21 LAB — CBC
HCT: 33.1 % — ABNORMAL LOW (ref 36.0–46.0)
Hemoglobin: 11.1 g/dL — ABNORMAL LOW (ref 12.0–15.0)
MCH: 30.7 pg (ref 26.0–34.0)
MCHC: 33.5 g/dL (ref 30.0–36.0)
MCV: 91.4 fL (ref 80.0–100.0)
Platelets: 244 10*3/uL (ref 150–400)
RBC: 3.62 MIL/uL — ABNORMAL LOW (ref 3.87–5.11)
RDW: 12.6 % (ref 11.5–15.5)
WBC: 13 10*3/uL — ABNORMAL HIGH (ref 4.0–10.5)
nRBC: 0 % (ref 0.0–0.2)

## 2020-01-21 LAB — URINALYSIS, ROUTINE W REFLEX MICROSCOPIC
Bacteria, UA: NONE SEEN
Bilirubin Urine: NEGATIVE
Glucose, UA: NEGATIVE mg/dL
Hgb urine dipstick: NEGATIVE
Ketones, ur: NEGATIVE mg/dL
Nitrite: NEGATIVE
Protein, ur: NEGATIVE mg/dL
Specific Gravity, Urine: 1.004 — ABNORMAL LOW (ref 1.005–1.030)
pH: 7 (ref 5.0–8.0)

## 2020-01-22 ENCOUNTER — Other Ambulatory Visit (HOSPITAL_COMMUNITY)
Admission: RE | Admit: 2020-01-22 | Discharge: 2020-01-22 | Disposition: A | Discharge status: Home / Self Care | Payer: Commercial Managed Care - PPO | Source: Ambulatory Visit | Attending: Urology | Admitting: Urology

## 2020-01-22 DIAGNOSIS — Z01812 Encounter for preprocedural laboratory examination: Secondary | ICD-10-CM | POA: Insufficient documentation

## 2020-01-22 DIAGNOSIS — Z20822 Contact with and (suspected) exposure to covid-19: Secondary | ICD-10-CM | POA: Diagnosis not present

## 2020-01-22 LAB — SARS CORONAVIRUS 2 (TAT 6-24 HRS): SARS Coronavirus 2: NEGATIVE

## 2020-01-22 NOTE — Progress Notes (Signed)
Left voicemail for Selita at Dr. Thomos Lemons office to request orders in epic from [facility] to place orders for epic for fetal monitoring for procedure 01/26/20.

## 2020-01-23 LAB — URINE CULTURE: Culture: 20000 — AB

## 2020-01-25 NOTE — H&P (Signed)
CC/HPI: cc: ureteral calculus in pregnancy   12/17/19: 24 yo woman currently [redacted] weeks pregnant who presents the ER with left flank pain found have a 9 mm proximal obstructing ureteral calculus with hydronephrosis now status post a left PCN tube. She returns for follow-up after the PCN tube was placed. She is feeling much better although she is anxious to get rid of the tube.   12/14/19: 24 year old woman currently about [redacted] weeks pregnant presents to the ER with left flank pain found to have a 9 mm proximal obstructing ureteral calculus and non-obstructing renal calculi with mild hydronephrosis. Patient went to ER last night and pain is poorly controlled. She has passed stones before but they have all been small. She has never required surgical intervention for her stones. Patient has uncontrolled pain in the office after 4 mg of morphine. No fever, chills. She does have nausea and emesis.     ALLERGIES: No Known Drug Allergies    MEDICATIONS: Flomax  Oxycodone Hcl  Phenergan  Prenatal Multivitamin  Tylenol 325 mg capsule     GU PSH: None   NON-GU PSH: None   GU PMH: Ureteral calculus, Patient with obstructing left proximal ureteral calculus and intractable pain. This is complicated by the fact that the patient is in her 1st trimester of pregnancy. I discussed different management options which include observation, PCN tube placement by IR and ureteral stent placement. It is not safe to treat the stone in the 1st trimester pregnancy and would require stent changes every 4 weeks due to increased station again not ideal in the 1st trimester. I recommended percutaneous nephrostomy tube now placement with subsequent treatment of the stone in the 2nd trimester. IR was contacted. Update: Patient was not able to be pain controlled in the office. She will be sent to the hospital for further evaluation and management of her pain. I have contacted Interventional Radiology for PCN placement. -  12/14/2019 Urinary Calculus, Unspec    NON-GU PMH: Anxiety Obsessive-compulsive personality disorder Seizure disorder    FAMILY HISTORY: Kidney Stones - Mother   SOCIAL HISTORY: Marital Status: Single Preferred Language: English; Ethnicity: Not Hispanic Or Latino; Race: White Current Smoking Status: Patient has never smoked.   Tobacco Use Assessment Completed: Used Tobacco in last 30 days? Does not drink anymore.  Drinks 1 caffeinated drink per day.    REVIEW OF SYSTEMS:    GU Review Female:   Patient denies frequent urination, hard to postpone urination, burning /pain with urination, get up at night to urinate, leakage of urine, stream starts and stops, trouble starting your stream, have to strain to urinate, and being pregnant.  Gastrointestinal (Upper):   Patient reports nausea. Patient denies vomiting and indigestion/ heartburn.  Gastrointestinal (Lower):   Patient denies diarrhea and constipation.  Constitutional:   Patient reports fatigue. Patient denies fever, night sweats, and weight loss.  Skin:   Patient denies skin rash/ lesion and itching.  Eyes:   Patient denies blurred vision and double vision.  Ears/ Nose/ Throat:   Patient denies sore throat and sinus problems.  Hematologic/Lymphatic:   Patient denies swollen glands and easy bruising.  Cardiovascular:   Patient denies leg swelling and chest pains.  Respiratory:   Patient denies cough and shortness of breath.  Endocrine:   Patient denies excessive thirst.  Musculoskeletal:   Patient reports back pain. Patient denies joint pain.  Neurological:   Patient denies headaches and dizziness.  Psychologic:   Patient denies depression and anxiety.  VITAL SIGNS:      12/17/2019 12:06 PM  Weight 131 lb / 59.42 kg  Height 60 in / 152.4 cm  BP 113/75 mmHg  Pulse 91 /min  BMI 25.6 kg/m   MULTI-SYSTEM PHYSICAL EXAMINATION:    Constitutional: Well-nourished. No physical deformities. Normally developed. Good grooming.   Neck: Neck symmetrical, not swollen. Normal tracheal position.  Respiratory: No labored breathing, no use of accessory muscles.   Cardiovascular: Normal temperature  Skin: No paleness, no jaundice, no cyanosis. No lesion, no ulcer, no rash.  Neurologic / Psychiatric: Oriented to time, oriented to place, oriented to person. No depression, no anxiety, no agitation.  Gastrointestinal: No mass, no tenderness, no rigidity, non obese abdomen. LEFT PCN tube site with clean dressing, draining clear yellow urine  Eyes: Normal conjunctivae. Normal eyelids.  Ears, Nose, Mouth, and Throat: Left ear no scars, no lesions, no masses. Right ear no scars, no lesions, no masses. Nose no scars, no lesions, no masses. Normal hearing. Normal lips.  Musculoskeletal: Normal gait and station of head and neck.     PAST DATA REVIEWED:  Source Of History:  Patient  X-Ray Review: Outside Ultrasound: Reviewed Films. Reviewed Report. Discussed With Radiologist.     PROCEDURES:          Urinalysis w/Scope Dipstick Dipstick Cont'd Micro  Color: Yellow Bilirubin: Neg mg/dL WBC/hpf: 10 - 20/hpf  Appearance: Cloudy Ketones: Neg mg/dL RBC/hpf: 20 - 40/hpf  Specific Gravity: 1.020 Blood: 3+ ery/uL Bacteria: Many (>50/hpf)  pH: 6.5 Protein: Trace mg/dL Cystals: NS (Not Seen)  Glucose: Neg mg/dL Urobilinogen: 0.2 mg/dL Casts: NS (Not Seen)    Nitrites: Neg Trichomonas: Not Present    Leukocyte Esterase: 2+ leu/uL Mucous: Not Present      Epithelial Cells: 6 - 10/hpf      Yeast: NS (Not Seen)      Sperm: Not Present    ASSESSMENT:      ICD-10 Details  1 GU:   Ureteral calculus - N20.1 Left, Acute, Uncomplicated - Patient status post PCN 2. Her pain is significantly improved. We will plan on ureteroscopy when she is in her 2nd trimester of 14 weeks. I discussed risks and benefits of ureteroscopy with laser lithotripsy with the patient. She understands that she will keep continue the PCN tube until her surgery. She will  be 14 weeks approximately the week of February 15th. Also urine for culture today. She is to remain on suppressive antibiotics while the PCN tubes in place.    PLAN:           Orders Labs CULTURE, URINE          Document Letter(s):  Created for Patient: Clinical Summary         Notes:   cc: Arnette Norris, MD  cc: [facility]

## 2020-01-25 NOTE — Anesthesia Preprocedure Evaluation (Addendum)
Anesthesia Evaluation  Patient identified by MRN, date of birth, ID band Patient awake    Reviewed: Allergy & Precautions, NPO status , Patient's Chart, lab work & pertinent test results  History of Anesthesia Complications Negative for: history of anesthetic complications  Airway Mallampati: I  TM Distance: >3 FB Neck ROM: Full    Dental  (+) Teeth Intact   Pulmonary neg pulmonary ROS,    Pulmonary exam normal        Cardiovascular negative cardio ROS Normal cardiovascular exam     Neuro/Psych PSYCHIATRIC DISORDERS Anxiety negative neurological ROS     GI/Hepatic negative GI ROS, Neg liver ROS,   Endo/Other  negative endocrine ROS  Renal/GU Renal disease (kidney stones)  negative genitourinary   Musculoskeletal negative musculoskeletal ROS (+)   Abdominal   Peds  Hematology negative hematology ROS (+)   Anesthesia Other Findings [redacted] weeks pregnant  Reproductive/Obstetrics (+) Pregnancy                            Anesthesia Physical Anesthesia Plan  ASA: II  Anesthesia Plan: General   Post-op Pain Management:    Induction: Intravenous  PONV Risk Score and Plan: 3 and Ondansetron and Treatment may vary due to age or medical condition  Airway Management Planned: LMA  Additional Equipment: None  Intra-op Plan:   Post-operative Plan: Extubation in OR  Informed Consent: I have reviewed the patients History and Physical, chart, labs and discussed the procedure including the risks, benefits and alternatives for the proposed anesthesia with the patient or authorized representative who has indicated his/her understanding and acceptance.     Dental advisory given  Plan Discussed with:   Anesthesia Plan Comments:       Anesthesia Quick Evaluation

## 2020-01-26 ENCOUNTER — Ambulatory Visit (HOSPITAL_COMMUNITY): Payer: Commercial Managed Care - PPO | Admitting: Anesthesiology

## 2020-01-26 ENCOUNTER — Encounter (HOSPITAL_COMMUNITY)
Admission: RE | Disposition: A | Discharge status: Home / Self Care | Payer: Self-pay | Source: Home / Self Care | Attending: Urology

## 2020-01-26 ENCOUNTER — Ambulatory Visit (HOSPITAL_COMMUNITY)
Admission: RE | Admit: 2020-01-26 | Discharge: 2020-01-26 | Disposition: A | Discharge status: Home / Self Care | Payer: Commercial Managed Care - PPO | Attending: Urology | Admitting: Urology

## 2020-01-26 ENCOUNTER — Ambulatory Visit (HOSPITAL_COMMUNITY): Payer: Commercial Managed Care - PPO

## 2020-01-26 DIAGNOSIS — Z3A16 16 weeks gestation of pregnancy: Secondary | ICD-10-CM | POA: Insufficient documentation

## 2020-01-26 DIAGNOSIS — N201 Calculus of ureter: Secondary | ICD-10-CM | POA: Diagnosis present

## 2020-01-26 DIAGNOSIS — Z792 Long term (current) use of antibiotics: Secondary | ICD-10-CM | POA: Diagnosis not present

## 2020-01-26 DIAGNOSIS — O99891 Other specified diseases and conditions complicating pregnancy: Secondary | ICD-10-CM | POA: Diagnosis not present

## 2020-01-26 DIAGNOSIS — O26899 Other specified pregnancy related conditions, unspecified trimester: Secondary | ICD-10-CM | POA: Insufficient documentation

## 2020-01-26 DIAGNOSIS — F419 Anxiety disorder, unspecified: Secondary | ICD-10-CM | POA: Insufficient documentation

## 2020-01-26 DIAGNOSIS — N132 Hydronephrosis with renal and ureteral calculous obstruction: Secondary | ICD-10-CM | POA: Insufficient documentation

## 2020-01-26 DIAGNOSIS — Z79899 Other long term (current) drug therapy: Secondary | ICD-10-CM | POA: Diagnosis not present

## 2020-01-26 HISTORY — PX: CYSTOSCOPY/URETEROSCOPY/HOLMIUM LASER/STENT PLACEMENT: SHX6546

## 2020-01-26 SURGERY — CYSTOSCOPY/URETEROSCOPY/HOLMIUM LASER/STENT PLACEMENT
Anesthesia: General | Site: Urethra | Laterality: Left

## 2020-01-26 MED ORDER — PROPOFOL 500 MG/50ML IV EMUL
INTRAVENOUS | Status: DC | PRN
  Administered 2020-01-26: 100 ug/kg/min via INTRAVENOUS

## 2020-01-26 MED ORDER — OXYCODONE HCL 5 MG PO CAPS: 5.0000 mg | ORAL_CAPSULE | Freq: Four times a day (QID) | ORAL | 0 refills | Status: AC | PRN

## 2020-01-26 MED ORDER — FENTANYL CITRATE (PF) 100 MCG/2ML IJ SOLN
INTRAMUSCULAR | Status: AC
  Filled 2020-01-26: qty 2

## 2020-01-26 MED ORDER — PHENYLEPHRINE HCL (PRESSORS) 10 MG/ML IV SOLN
INTRAVENOUS | Status: DC | PRN
  Administered 2020-01-26 (×3): 40 ug via INTRAVENOUS

## 2020-01-26 MED ORDER — LIDOCAINE 2% (20 MG/ML) 5 ML SYRINGE
INTRAMUSCULAR | Status: AC
  Filled 2020-01-26: qty 5

## 2020-01-26 MED ORDER — METOCLOPRAMIDE HCL 5 MG/ML IJ SOLN
INTRAMUSCULAR | Status: DC | PRN
  Administered 2020-01-26: 10 mg via INTRAVENOUS

## 2020-01-26 MED ORDER — 0.9 % SODIUM CHLORIDE (POUR BTL) OPTIME
TOPICAL | Status: DC | PRN
  Administered 2020-01-26: 1000 mL

## 2020-01-26 MED ORDER — PROPOFOL 10 MG/ML IV BOLUS
INTRAVENOUS | Status: DC | PRN
  Administered 2020-01-26: 200 mg via INTRAVENOUS
  Administered 2020-01-26: 100 mg via INTRAVENOUS
  Administered 2020-01-26: 80 mg via INTRAVENOUS

## 2020-01-26 MED ORDER — MIDAZOLAM HCL 2 MG/2ML IJ SOLN
INTRAMUSCULAR | Status: AC
  Filled 2020-01-26: qty 2

## 2020-01-26 MED ORDER — FENTANYL CITRATE (PF) 100 MCG/2ML IJ SOLN
25.0000 ug | INTRAMUSCULAR | Status: DC | PRN
  Administered 2020-01-26: 25 ug via INTRAVENOUS
  Administered 2020-01-26: 50 ug via INTRAVENOUS

## 2020-01-26 MED ORDER — LACTATED RINGERS IV SOLN: INTRAVENOUS | Status: DC

## 2020-01-26 MED ORDER — SODIUM CHLORIDE 0.9 % IV SOLN
2.0000 g | INTRAVENOUS | Status: AC
  Administered 2020-01-26: 2 g via INTRAVENOUS
  Filled 2020-01-26: qty 20

## 2020-01-26 MED ORDER — PROPOFOL 10 MG/ML IV BOLUS
INTRAVENOUS | Status: AC
  Filled 2020-01-26: qty 20

## 2020-01-26 MED ORDER — PHENYLEPHRINE 40 MCG/ML (10ML) SYRINGE FOR IV PUSH (FOR BLOOD PRESSURE SUPPORT)
PREFILLED_SYRINGE | INTRAVENOUS | Status: AC
  Filled 2020-01-26: qty 10

## 2020-01-26 MED ORDER — ACETAMINOPHEN 10 MG/ML IV SOLN
INTRAVENOUS | Status: AC
  Filled 2020-01-26: qty 100

## 2020-01-26 MED ORDER — METOCLOPRAMIDE HCL 5 MG/ML IJ SOLN
INTRAMUSCULAR | Status: AC
  Filled 2020-01-26: qty 2

## 2020-01-26 MED ORDER — LIDOCAINE HCL (CARDIAC) PF 100 MG/5ML IV SOSY
PREFILLED_SYRINGE | INTRAVENOUS | Status: DC | PRN
  Administered 2020-01-26: 60 mg via INTRAVENOUS

## 2020-01-26 MED ORDER — ONDANSETRON HCL 4 MG/2ML IJ SOLN
INTRAMUSCULAR | Status: DC | PRN
  Administered 2020-01-26 (×2): 4 mg via INTRAVENOUS

## 2020-01-26 MED ORDER — ONDANSETRON HCL 4 MG/2ML IJ SOLN
INTRAMUSCULAR | Status: AC
  Filled 2020-01-26: qty 2

## 2020-01-26 MED ORDER — SODIUM CHLORIDE 0.9 % IR SOLN
Status: DC | PRN
  Administered 2020-01-26: 3000 mL via INTRAVESICAL

## 2020-01-26 MED ORDER — DEXMEDETOMIDINE HCL IN NACL 200 MCG/50ML IV SOLN
INTRAVENOUS | Status: AC
  Filled 2020-01-26: qty 50

## 2020-01-26 MED ORDER — DEXMEDETOMIDINE HCL 200 MCG/2ML IV SOLN
INTRAVENOUS | Status: DC | PRN
  Administered 2020-01-26: 4 ug via INTRAVENOUS
  Administered 2020-01-26 (×3): 8 ug via INTRAVENOUS

## 2020-01-26 MED ORDER — FENTANYL CITRATE (PF) 100 MCG/2ML IJ SOLN
INTRAMUSCULAR | Status: DC | PRN
  Administered 2020-01-26 (×2): 50 ug via INTRAVENOUS

## 2020-01-26 MED ORDER — PROPOFOL 1000 MG/100ML IV EMUL
INTRAVENOUS | Status: AC
  Filled 2020-01-26: qty 100

## 2020-01-26 MED ORDER — ACETAMINOPHEN 10 MG/ML IV SOLN
INTRAVENOUS | Status: DC | PRN
  Administered 2020-01-26: 1000 mg via INTRAVENOUS

## 2020-01-26 MED ORDER — DIPHENHYDRAMINE HCL 50 MG/ML IJ SOLN
INTRAMUSCULAR | Status: AC
  Filled 2020-01-26: qty 1

## 2020-01-26 MED ORDER — DIPHENHYDRAMINE HCL 50 MG/ML IJ SOLN
INTRAMUSCULAR | Status: DC | PRN
  Administered 2020-01-26 (×2): 12.5 mg via INTRAVENOUS

## 2020-01-26 SURGICAL SUPPLY — 22 items
BAG URO CATCHER STRL LF (MISCELLANEOUS) ×3 IMPLANT
BASKET ZERO TIP NITINOL 2.4FR (BASKET) ×2 IMPLANT
BSKT STON RTRVL ZERO TP 2.4FR (BASKET) ×1
CATH URET 5FR 28IN OPEN ENDED (CATHETERS) ×3 IMPLANT
CLOTH BEACON ORANGE TIMEOUT ST (SAFETY) ×3 IMPLANT
DRSG TEGADERM 2-3/8X2-3/4 SM (GAUZE/BANDAGES/DRESSINGS) ×3 IMPLANT
EXTRACTOR STONE 1.7FRX115CM (UROLOGICAL SUPPLIES) IMPLANT
FIBER LASER TRAC TIP (UROLOGICAL SUPPLIES) ×2 IMPLANT
GLOVE BIO SURGEON STRL SZ 6.5 (GLOVE) ×2 IMPLANT
GLOVE BIO SURGEONS STRL SZ 6.5 (GLOVE) ×1
GOWN STRL REUS W/TWL LRG LVL3 (GOWN DISPOSABLE) ×3 IMPLANT
GUIDEWIRE STR DUAL SENSOR (WIRE) ×5 IMPLANT
KIT TURNOVER KIT A (KITS) IMPLANT
MANIFOLD NEPTUNE II (INSTRUMENTS) ×3 IMPLANT
PACK CYSTO (CUSTOM PROCEDURE TRAY) ×3 IMPLANT
SHEATH URETERAL 12FRX28CM (UROLOGICAL SUPPLIES) ×2 IMPLANT
SHEATH URETERAL 12FRX35CM (MISCELLANEOUS) IMPLANT
STENT CONTOUR 6FRX24X.038 (STENTS) ×2 IMPLANT
TUBING CONNECTING 10 (TUBING) ×2 IMPLANT
TUBING CONNECTING 10' (TUBING) ×1
TUBING UROLOGY SET (TUBING) ×3 IMPLANT
WIRE COONS/BENSON .038X145CM (WIRE) IMPLANT

## 2020-01-26 NOTE — Progress Notes (Signed)
Talked with OB Rapid Response Nurse, Carollee Herter, about getting Fetal Heart Tones prior to surgery. She relayed understanding and that patient would be going to surgery around 1030.

## 2020-01-26 NOTE — Anesthesia Postprocedure Evaluation (Signed)
Anesthesia Post Note  Patient: Emily Lowe  Procedure(s) Performed: CYSTOSCOPY/URETEROSCOPY/HOLMIUM LASER/STENT PLACEMENT/REMOVAL PERCUTANEOUS NEPHROSTOMY TUBE LEFT (Left Urethra)     Patient location during evaluation: PACU Anesthesia Type: General Level of consciousness: awake and alert Pain management: pain level controlled Vital Signs Assessment: post-procedure vital signs reviewed and stable Respiratory status: spontaneous breathing, nonlabored ventilation and respiratory function stable Cardiovascular status: blood pressure returned to baseline and stable Postop Assessment: no apparent nausea or vomiting Anesthetic complications: no    Last Vitals:  Vitals:   01/26/20 1300 01/26/20 1315  BP: (!) 96/56 (!) 97/59  Pulse: 85 90  Resp: 12 16  Temp:  37.3 C  SpO2: 100% 100%    Last Pain:  Vitals:   01/26/20 1315  TempSrc:   PainSc: 3                  Lucretia Kern

## 2020-01-26 NOTE — Progress Notes (Signed)
G1P0 at 16 weeks in [facility] Short Stay here for left uteroscopy,  laser lithotripsy and stent placement due to kidney stones.  Dopplered FHT's 151.  Dr Ellyn Hack aware of pt status.

## 2020-01-26 NOTE — Interval H&P Note (Signed)
History and Physical Interval Note: Patient urinalysis was negative however urine culture grew out 20,000 of Enterococcus.  Patient is asymptomatic.  She was given ampicillin to start yesterday and has confirmed that she is taken 2 doses.  Proceed with surgery as planned.  01/26/2020 10:45 AM  Emily Lowe  has presented today for surgery, with the diagnosis of LEFT URETERAL CALCULUS.  The various methods of treatment have been discussed with the patient and family. After consideration of risks, benefits and other options for treatment, the patient has consented to  Procedure(s) with comments: CYSTOSCOPY/URETEROSCOPY/HOLMIUM LASER/STENT PLACEMENT (Left) - 90 MINS as a surgical intervention.  The patient's history has been reviewed, patient examined, no change in status, stable for surgery.  I have reviewed the patient's chart and labs.  Questions were answered to the patient's satisfaction.     Starla Deller D Marcial Pless

## 2020-01-26 NOTE — Anesthesia Procedure Notes (Signed)
Procedure Name: LMA Insertion Date/Time: 01/26/2020 11:03 AM Performed by: Ludwig Lean, CRNA Pre-anesthesia Checklist: Patient identified, Emergency Drugs available, Suction available and Patient being monitored Patient Re-evaluated:Patient Re-evaluated prior to induction Oxygen Delivery Method: Circle system utilized Preoxygenation: Pre-oxygenation with 100% oxygen Induction Type: IV induction Ventilation: Mask ventilation without difficulty LMA: LMA inserted and LMA with gastric port inserted LMA Size: 4.0 Number of attempts: 1 Placement Confirmation: positive ETCO2 and breath sounds checked- equal and bilateral Tube secured with: Tape Dental Injury: Teeth and Oropharynx as per pre-operative assessment

## 2020-01-26 NOTE — Transfer of Care (Signed)
Immediate Anesthesia Transfer of Care Note  Patient: Emily Lowe  Procedure(s) Performed: Procedure(s) with comments: CYSTOSCOPY/URETEROSCOPY/HOLMIUM LASER/STENT PLACEMENT/REMOVAL PERCUTANEOUS NEPHROSTOMY TUBE LEFT (Left) - 90 MINS  Patient Location: PACU  Anesthesia Type:General  Level of Consciousness: Patient comfortable, responds to stimulation.   Airway & Oxygen Therapy: Patient spontaneously breathing, ventilating well, oxygen via simple oxygen mask.  Post-op Assessment: Report given to PACU RN, vital signs reviewed and stable, moving all extremities.   Post vital signs: Reviewed and stable.  Complications: No apparent anesthesia complications  Last Vitals:  Vitals Value Taken Time  BP 99/54 01/26/20 1209  Temp 36.8 C 01/26/20 1209  Pulse 81 01/26/20 1211  Resp 18 01/26/20 1211  SpO2 100 % 01/26/20 1211  Vitals shown include unvalidated device data.  Last Pain:  Vitals:   01/26/20 0907  TempSrc: Oral         Complications: No apparent anesthesia complications

## 2020-01-26 NOTE — Op Note (Signed)
Preoperative diagnosis: left ureteral calculus  Postoperative diagnosis: left ureteral calculus  Procedure:  1. Cystoscopy 2. left ureteroscopy and stone removal 3. Ureteroscopic laser lithotripsy 4. left 16F x 24 ureteral stent placement   Surgeon: Jacalyn Lefevre, MD  Anesthesia: General  Complications: None  Intraoperative findings: 1.  Normal urethra 2.  9 mm proximal left ureteral calculus fragmented 3.  6 French by 24 cm left ureteral stent with string 4.  Retrograde pyelogram not obtained due to patient being pregnant and trying to limit fluoroscopy   EBL: Minimal  Specimens: 1. right ureteral calculus  Disposition of specimens: [facility] for stone analysis  Indication: 24 yo pregnant woman wih a  left ureteral stone who underwent left percutaneous nephrostomy tube placement in her first trimester.  She is now on her second trimester and returns for definitive management of stone.. After reviewing the management options for treatment, the patient elected to proceed with the above surgical procedure(s). We have discussed the potential benefits and risks of the procedure, side effects of the proposed treatment, the likelihood of the patient achieving the goals of the procedure, and any potential problems that might occur during the procedure or recuperation. Informed consent has been obtained.   Description of procedure:  The patient was taken to the operating room and general anesthesia was induced.  The patient was placed in the dorsal lithotomy position, prepped and draped in the usual sterile fashion, and preoperative antibiotics were administered.  An additional bump was placed under her right hip due to patient's pregnancy status.  Her left nephrostomy tube was opened and left to gravity drainage during the procedure.  A preoperative time-out was performed.   Cystourethroscopy was performed.  The patient's urethra was examined and was normal.  The  bladder was then systematically examined in its entirety. There was no evidence for any bladder tumors, stones, or other mucosal pathology.    Attention then turned to the left ureteral orifice and a ureteral catheter was used to intubate the ureteral orifice and gently dilate the ureter.  A retrograde pyelogram was not performed due to patient being pregnant. A 0.38 sensor guidewire was then advanced up the left ureter into the renal pelvis gentle resistance was met. The 4.5Fr semirigid ureteroscope was then advanced into the ureter next to the guidewire and the calculus was identified at the UPJ.    The stone was then fragmented with the 200 micron holmium laser fiber.  All stones were then removed from the ureter with a 0-tip basket.  Reinspection of the ureter revealed no remaining visible stones or fragments.  A second wire was then advanced through the ureteroscope.  The ureteroscope was removed.  1 attempt was made to place a ureteral access sheath over one of the wires however this was unsuccessful.  The decision was made to move to stent placement.  The wire was then backloaded through the cystoscope and a ureteral stent was advance over the wire using Seldinger technique.  The stent was positioned appropriately with cystoscopic guidance.  2 spot images were taken to show the proximal curl in the kidney and the wire was then removed with an adequate stent curl noted in the renal pelvis as well as in the bladder.  The bladder was then emptied and the procedure ended.  The patient appeared to tolerate the procedure well and without complications.  The patient was able to be awakened and transferred to the recovery unit in satisfactory condition.   Disposition: The  tether of the stent was left and tucked inside the patient's vagina.  Instructions for removing the stent have been provided to the patient. The patient will be scheduled for stent removal in 5-7 days in our clinic.

## 2020-01-26 NOTE — Discharge Instructions (Signed)
[facility] [phone] Post Ureteroscopy With or Without Stent Instructions  Definitions:  Ureter: The duct that transports urine from the kidney to the bladder. Stent:   A plastic hollow tube that is placed into the ureter, from the kidney to the bladder to prevent the ureter from swelling shut.  GENERAL INSTRUCTIONS:  Despite the fact that no skin incisions were used, the area around the ureter and bladder is raw and irritated. The stent is a foreign body which will further irritate the bladder wall. This irritation is manifested by increased frequency of urination, both day and night, and by an increase in the urge to urinate. In some, the urge to urinate is present almost always. Sometimes the urge is strong enough that you may not be able to stop yourself from urinating. The only real cure is to remove the stent and then give time for the bladder wall to heal which can't be done until the danger of the ureter swelling shut has passed, which varies.  You may see some blood in your urine while the stent is in place and a few days afterwards. Do not be alarmed, even if the urine was clear for a while. Get off your feet and drink lots of fluids until clearing occurs. If you start to pass clots or don't improve, call us.  DIET: You may return to your normal diet immediately. Because of the raw surface of your bladder, alcohol, spicy foods, acid type foods and drinks with caffeine may cause irritation or frequency and should be used in moderation. To keep your urine flowing freely and to avoid constipation, drink plenty of fluids during the day ( 8-10 glasses ). Tip: Avoid cranberry juice because it is very acidic.  ACTIVITY: Your physical activity doesn't need to be restricted. However, if you are very active, you may see some blood in your urine. We suggest that you reduce your activity under these circumstances until the bleeding has stopped.  BOWELS: It is important to  keep your bowels regular during the postoperative period. Straining with bowel movements can cause bleeding. A bowel movement every other day is reasonable. Use a mild laxative if needed, such as Milk of Magnesia 2-3 tablespoons, or 2 Dulcolax tablets. Call if you continue to have problems. If you have been taking narcotics for pain, before, during or after your surgery, you may be constipated. Take a laxative if necessary.  NEPHROSTOMY You have a bandage in place for your left nephrostomy tube was.  The site will continue to leak for 48 to 72 hours.  You may replace the bandage as necessary.  No soaking in a tub bath until it is completely stopped draining.   MEDICATION: You should resume your pre-surgery medications unless told not to. Take pain medication as directed for pain refractory to conservative management  PROBLEMS YOU SHOULD REPORT TO [place]:  Fevers over 100.5 Fahrenheit.  Heavy bleeding, or clots ( See above notes about blood in urine ).  Inability to urinate.  Drug reactions ( hives, rash, nausea, vomiting, diarrhea ).  Severe burning or pain with urination that is not improving.

## 2020-01-26 NOTE — Addendum Note (Signed)
Addendum  created 01/26/20 1359 by Ludwig Lean, CRNA   Flowsheet accepted, Intraprocedure Flowsheets edited

## 2020-03-18 ENCOUNTER — Other Ambulatory Visit: Payer: Self-pay

## 2020-03-18 ENCOUNTER — Ambulatory Visit (HOSPITAL_COMMUNITY): Payer: Commercial Managed Care - PPO | Admitting: Anesthesiology

## 2020-03-18 ENCOUNTER — Ambulatory Visit (HOSPITAL_COMMUNITY): Payer: Commercial Managed Care - PPO

## 2020-03-18 ENCOUNTER — Encounter (HOSPITAL_COMMUNITY): Payer: Self-pay | Admitting: Urology

## 2020-03-18 ENCOUNTER — Ambulatory Visit (HOSPITAL_COMMUNITY)
Admission: RE | Admit: 2020-03-18 | Discharge: 2020-03-18 | Disposition: A | Discharge status: Home / Self Care | Payer: Commercial Managed Care - PPO | Source: Ambulatory Visit | Attending: Urology | Admitting: Urology

## 2020-03-18 ENCOUNTER — Other Ambulatory Visit: Payer: Self-pay | Admitting: Urology

## 2020-03-18 ENCOUNTER — Encounter (HOSPITAL_COMMUNITY)
Admission: RE | Disposition: A | Discharge status: Home / Self Care | Payer: Self-pay | Source: Ambulatory Visit | Attending: Urology

## 2020-03-18 DIAGNOSIS — O99891 Other specified diseases and conditions complicating pregnancy: Secondary | ICD-10-CM | POA: Insufficient documentation

## 2020-03-18 DIAGNOSIS — Z20822 Contact with and (suspected) exposure to covid-19: Secondary | ICD-10-CM | POA: Diagnosis not present

## 2020-03-18 DIAGNOSIS — Z87442 Personal history of urinary calculi: Secondary | ICD-10-CM | POA: Insufficient documentation

## 2020-03-18 DIAGNOSIS — O2312 Infections of bladder in pregnancy, second trimester: Secondary | ICD-10-CM | POA: Insufficient documentation

## 2020-03-18 DIAGNOSIS — Z9889 Other specified postprocedural states: Secondary | ICD-10-CM | POA: Insufficient documentation

## 2020-03-18 DIAGNOSIS — Z3A24 24 weeks gestation of pregnancy: Secondary | ICD-10-CM | POA: Diagnosis not present

## 2020-03-18 DIAGNOSIS — N3001 Acute cystitis with hematuria: Secondary | ICD-10-CM | POA: Diagnosis not present

## 2020-03-18 DIAGNOSIS — N132 Hydronephrosis with renal and ureteral calculous obstruction: Secondary | ICD-10-CM | POA: Diagnosis not present

## 2020-03-18 HISTORY — PX: CYSTOSCOPY W/ URETERAL STENT PLACEMENT: SHX1429

## 2020-03-18 LAB — RESPIRATORY PANEL BY RT PCR (FLU A&B, COVID)
Influenza A by PCR: NEGATIVE
Influenza B by PCR: NEGATIVE
SARS Coronavirus 2 by RT PCR: NEGATIVE

## 2020-03-18 SURGERY — CYSTOSCOPY, FLEXIBLE, WITH STENT REPLACEMENT
Anesthesia: General | Site: Urethra | Laterality: Right

## 2020-03-18 MED ORDER — MEPERIDINE HCL 50 MG/ML IJ SOLN: 6.2500 mg | INTRAMUSCULAR | Status: DC | PRN

## 2020-03-18 MED ORDER — PROPOFOL 10 MG/ML IV BOLUS
INTRAVENOUS | Status: AC
  Filled 2020-03-18: qty 20

## 2020-03-18 MED ORDER — FENTANYL CITRATE (PF) 100 MCG/2ML IJ SOLN
INTRAMUSCULAR | Status: DC | PRN
  Administered 2020-03-18: 100 ug via INTRAVENOUS

## 2020-03-18 MED ORDER — CEFAZOLIN SODIUM-DEXTROSE 2-4 GM/100ML-% IV SOLN
2.0000 g | Freq: Once | INTRAVENOUS | Status: AC
  Administered 2020-03-18: 2 g via INTRAVENOUS
  Filled 2020-03-18: qty 100

## 2020-03-18 MED ORDER — FENTANYL CITRATE (PF) 100 MCG/2ML IJ SOLN
INTRAMUSCULAR | Status: AC
  Filled 2020-03-18: qty 2

## 2020-03-18 MED ORDER — OXYCODONE HCL 5 MG PO TABS: 5.0000 mg | ORAL_TABLET | Freq: Once | ORAL | Status: DC | PRN

## 2020-03-18 MED ORDER — OXYCODONE HCL 5 MG/5ML PO SOLN: 5.0000 mg | Freq: Once | ORAL | Status: DC | PRN

## 2020-03-18 MED ORDER — FENTANYL CITRATE (PF) 100 MCG/2ML IJ SOLN
25.0000 ug | INTRAMUSCULAR | Status: DC | PRN
  Administered 2020-03-18: 25 ug via INTRAVENOUS

## 2020-03-18 MED ORDER — ACETAMINOPHEN 325 MG PO TABS: 325.0000 mg | ORAL_TABLET | ORAL | Status: DC | PRN

## 2020-03-18 MED ORDER — HYDROMORPHONE HCL 2 MG PO TABS: 2.0000 mg | ORAL_TABLET | Freq: Four times a day (QID) | ORAL | 0 refills | Status: DC | PRN

## 2020-03-18 MED ORDER — LACTATED RINGERS IV SOLN: INTRAVENOUS | Status: DC

## 2020-03-18 MED ORDER — PROPOFOL 10 MG/ML IV BOLUS
INTRAVENOUS | Status: DC | PRN
  Administered 2020-03-18: 40 mg via INTRAVENOUS
  Administered 2020-03-18: 200 mg via INTRAVENOUS

## 2020-03-18 MED ORDER — PROMETHAZINE HCL 12.5 MG PO TABS
12.5000 mg | ORAL_TABLET | Freq: Four times a day (QID) | ORAL | 0 refills | Status: DC | PRN
Start: 2020-03-18 — End: 2020-07-20

## 2020-03-18 MED ORDER — OXYBUTYNIN CHLORIDE 5 MG PO TABS: 5.0000 mg | ORAL_TABLET | Freq: Three times a day (TID) | ORAL | 1 refills | Status: DC | PRN

## 2020-03-18 MED ORDER — ACETAMINOPHEN 160 MG/5ML PO SOLN: 325.0000 mg | ORAL | Status: DC | PRN

## 2020-03-18 MED ORDER — PHENYLEPHRINE 40 MCG/ML (10ML) SYRINGE FOR IV PUSH (FOR BLOOD PRESSURE SUPPORT)
PREFILLED_SYRINGE | INTRAVENOUS | Status: DC | PRN
  Administered 2020-03-18: 80 ug via INTRAVENOUS
  Administered 2020-03-18: 40 ug via INTRAVENOUS

## 2020-03-18 MED ORDER — SODIUM CHLORIDE 0.9 % IR SOLN
Status: DC | PRN
  Administered 2020-03-18: 3000 mL

## 2020-03-18 MED ORDER — ONDANSETRON HCL 4 MG/2ML IJ SOLN: 4.0000 mg | Freq: Once | INTRAMUSCULAR | Status: DC | PRN

## 2020-03-18 MED ORDER — ONDANSETRON HCL 4 MG/2ML IJ SOLN
INTRAMUSCULAR | Status: DC | PRN
  Administered 2020-03-18: 4 mg via INTRAVENOUS

## 2020-03-18 MED ORDER — PHENYLEPHRINE 40 MCG/ML (10ML) SYRINGE FOR IV PUSH (FOR BLOOD PRESSURE SUPPORT)
PREFILLED_SYRINGE | INTRAVENOUS | Status: AC
  Filled 2020-03-18: qty 10

## 2020-03-18 MED ORDER — CEPHALEXIN 500 MG PO CAPS
500.0000 mg | ORAL_CAPSULE | Freq: Two times a day (BID) | ORAL | 0 refills | Status: AC
Start: 2020-03-18 — End: 2020-03-23

## 2020-03-18 SURGICAL SUPPLY — 12 items
BAG URO CATCHER STRL LF (MISCELLANEOUS) ×3 IMPLANT
CATH URET 5FR 28IN OPEN ENDED (CATHETERS) ×3 IMPLANT
CLOTH BEACON ORANGE TIMEOUT ST (SAFETY) ×3 IMPLANT
GLOVE BIOGEL M STRL SZ7.5 (GLOVE) ×3 IMPLANT
GOWN STRL REUS W/TWL XL LVL3 (GOWN DISPOSABLE) ×3 IMPLANT
GUIDEWIRE STR DUAL SENSOR (WIRE) IMPLANT
GUIDEWIRE ZIPWRE .038 STRAIGHT (WIRE) ×3 IMPLANT
KIT TURNOVER KIT A (KITS) IMPLANT
MANIFOLD NEPTUNE II (INSTRUMENTS) ×3 IMPLANT
TRAY CYSTO PACK (CUSTOM PROCEDURE TRAY) ×3 IMPLANT
TUBING CONNECTING 10 (TUBING) ×2 IMPLANT
TUBING CONNECTING 10' (TUBING) ×1

## 2020-03-18 NOTE — Transfer of Care (Signed)
Immediate Anesthesia Transfer of Care Note  Patient: Emily Lowe  Procedure(s) Performed: CYSTOSCOPY WITH STENT REPLACEMENT (Right Urethra)  Patient Location: PACU  Anesthesia Type:General   Level of Consciousness: sedated, patient cooperative and responds to stimulation  Airway & Oxygen Therapy: Patient Spontanous Breathing and Patient connected to face mask oxygen  Post-op Assessment: Report given to RN and Post -op Vital signs reviewed and stable  Post vital signs: Reviewed and stable  Last Vitals:  Vitals Value Taken Time  BP    Temp    Pulse    Resp    SpO2      Last Pain:  Vitals:   03/18/20 1543  TempSrc:   PainSc: 2       Patients Stated Pain Goal: 2 (03/18/20 1543)  Complications: No apparent anesthesia complications

## 2020-03-18 NOTE — Op Note (Signed)
Operative Note  Preoperative diagnosis:  1.  Acute cystitis 2.  Right hydronephrosis 3.  History of kidney stones 4.  [redacted] weeks gestation  Postoperative diagnosis: Same  Procedure(s): 1.  Cystoscopy with right JJ stent placement  Surgeon: Rhoderick Moody, MD  Assistants:  None  Anesthesia:  General  Complications:  None  EBL: Less than 5 mL  Specimens: 1.  None  Drains/Catheters: 1.  Right 6 French, 24 cm JJ stent without tether  Intraoperative findings:   1. Fluoroscopy revealed a calcification within the right renal shadow, with no other abnormalities noted.  Stent in good position.  Indication:  Emily Lowe is a 24 y.o. female currently at [redacted] weeks gestation with a history of kidney stones who presented to the clinic today with worsening right-sided flank pain associated with nausea and vomiting.  Urinalysis in the office revealed microscopic hematuria with pyuria, concerning for acute cystitis.  Renal ultrasound also revealed mild to moderate right-sided hydronephrosis.  The patient has been consented for the above procedures, voices understanding and wishes to proceed.  Description of procedure:  After informed consent was obtained, the patient was brought to the operating room and general LMA anesthesia was administered. The patient was then placed in the dorsolithotomy position and prepped and draped in the usual sterile fashion. A timeout was performed. A 23 French rigid cystoscope was then inserted into the urethral meatus and advanced into the bladder under direct vision. A complete bladder survey revealed no intravesical pathology.  A Glidewire was then used to intubate the right ureteral orifice and was advanced up the right renal pelvis, under fluoroscopic guidance.  A 6 French, 24 cm JJ stent was then advanced over the wire and into good position within the right collecting system, confirming placement via fluoroscopy.  The patient's bladder was then  drained.  She tolerated the procedure well and was transferred to the postanesthesia in stable condition.  Plan: Follow-up with Dr. Arita Miss to plan definitive stone treatment.

## 2020-03-18 NOTE — Anesthesia Procedure Notes (Signed)
Procedure Name: Intubation Performed by: Gean Maidens, CRNA Pre-anesthesia Checklist: Patient identified, Emergency Drugs available, Suction available, Patient being monitored and Timeout performed Patient Re-evaluated:Patient Re-evaluated prior to induction Oxygen Delivery Method: Circle system utilized Preoxygenation: Pre-oxygenation with 100% oxygen Induction Type: IV induction and Rapid sequence Laryngoscope Size: Mac and 3 Grade View: Grade I Tube type: Oral Tube size: 7.0 mm Number of attempts: 1 Airway Equipment and Method: Stylet Placement Confirmation: ETT inserted through vocal cords under direct vision,  positive ETCO2 and breath sounds checked- equal and bilateral Secured at: 21 cm Tube secured with: Tape Dental Injury: Teeth and Oropharynx as per pre-operative assessment

## 2020-03-18 NOTE — Progress Notes (Signed)
Post-op NST completed. Appropriate for GA and post anesthesia.  Fetal movement palpated.

## 2020-03-18 NOTE — Anesthesia Preprocedure Evaluation (Signed)
Anesthesia Evaluation  Patient identified by MRN, date of birth, ID band Patient awake    Reviewed: Allergy & Precautions, H&P , NPO status , Patient's Chart, lab work & pertinent test results, reviewed documented beta blocker date and time   Airway Mallampati: II  TM Distance: >3 FB Neck ROM: full    Dental no notable dental hx.    Pulmonary neg pulmonary ROS,    Pulmonary exam normal breath sounds clear to auscultation       Cardiovascular Exercise Tolerance: Good negative cardio ROS   Rhythm:regular Rate:Normal     Neuro/Psych PSYCHIATRIC DISORDERS Anxiety Depression negative neurological ROS     GI/Hepatic negative GI ROS, Neg liver ROS,   Endo/Other  negative endocrine ROS  Renal/GU Renal disease  negative genitourinary   Musculoskeletal   Abdominal   Peds  Hematology negative hematology ROS (+)   Anesthesia Other Findings   Reproductive/Obstetrics                             Anesthesia Physical Anesthesia Plan  ASA: II  Anesthesia Plan: General   Post-op Pain Management:    Induction: Intravenous and Cricoid pressure planned  PONV Risk Score and Plan: 3 and Ondansetron and Treatment may vary due to age or medical condition  Airway Management Planned: Oral ETT  Additional Equipment:   Intra-op Plan:   Post-operative Plan: Extubation in OR  Informed Consent: I have reviewed the patients History and Physical, chart, labs and discussed the procedure including the risks, benefits and alternatives for the proposed anesthesia with the patient or authorized representative who has indicated his/her understanding and acceptance.     Dental Advisory Given  Plan Discussed with: CRNA and Anesthesiologist  Anesthesia Plan Comments: (  )        Anesthesia Quick Evaluation

## 2020-03-18 NOTE — H&P (Signed)
PRE-OP H&P  Office Visit Report     03/18/2020   Saliha Salts  MRN: [id number]  DOB: [birth year], 24 year old Female  SSN:    PRIMARY CARE:  Talia M. Dayton Martes, MD  REFERRING:    PROVIDER:  Kasandra Knudsen, M.D.  TREATING:  Rhoderick Moody, M.D.  LOCATION:  [facility] [phone]     --------------------------------------------------------------------------------   CC: I have pain in the flank.  HPI: Kurstin Dimarzo is a 24 year-old female established patient who is here for flank pain.  The problem is on the right side. Her pain started about approximately 03/16/2020. The pain is sharp. The intensity of her pain is rated as a 8. The pain is intermittent. The pain does not radiate.   Sitting< makes the pain better. Nothing causes the pain to become worse.   She has had this same pain previously. She has had kidney stones.   -The pt is currently a [redacted] weeks gestation  -s/p left URS for a 9 mm ureteral stone in March with Dr. Arita Miss  -Had ongoing issues with flank pain following PCN and ureteroscopy  -N/V started this AM  -Denies fevers/chills, dysuria or hematuria.  -UA today shows signs of cystitis   OB- Dr. Jackelyn Knife [facility])     ALLERGIES: No Known Drug Allergies    MEDICATIONS: Flomax  Hydromorphone Hcl 2 mg tablet 1 tablet PO Q 6 H PRN  Oxycodone Hcl  Phenergan  Prenatal Multivitamin  Tylenol 325 mg capsule     GU PSH: Ureteroscopic laser litho, Left - 01/26/2020     NON-GU PSH: No Non-GU PSH    GU PMH: Flank Pain - 02/01/2020 Ureteral calculus, Patient with obstructing left proximal ureteral calculus and intractable pain. This is complicated by the fact that the patient is in her 1st trimester of pregnancy. I discussed different management options which include observation, PCN tube placement by IR and ureteral stent placement. It is not safe to treat the stone in the 1st trimester pregnancy and would require stent changes every 4 weeks due to  increased station again not ideal in the 1st trimester. I recommended percutaneous nephrostomy tube now placement with subsequent treatment of the stone in the 2nd trimester. IR was contacted. Update: Patient was not able to be pain controlled in the office. She will be sent to the hospital for further evaluation and management of her pain. I have contacted Interventional Radiology for PCN placement. - 12/14/2019 Urinary Calculus, Unspec    NON-GU PMH: Anxiety Obsessive-compulsive personality disorder Seizure disorder    FAMILY HISTORY: Kidney Stones - Mother   SOCIAL HISTORY: Marital Status: Single Preferred Language: English; Ethnicity: Not Hispanic Or Latino; Race: White Current Smoking Status: Patient has never smoked.   Tobacco Use Assessment Completed: Used Tobacco in last 30 days? Does not drink anymore.  Drinks 1 caffeinated drink per day.    REVIEW OF SYSTEMS:    GU Review Female:   Patient denies frequent urination, hard to postpone urination, burning /pain with urination, get up at night to urinate, leakage of urine, stream starts and stops, trouble starting your stream, have to strain to urinate, and being pregnant.  Gastrointestinal (Upper):   Patient reports nausea and vomiting. Patient denies indigestion/ heartburn.  Gastrointestinal (Lower):   Patient reports constipation. Patient denies diarrhea.  Constitutional:   Patient denies fever, night sweats, weight loss, and fatigue.  Skin:   Patient denies skin rash/ lesion and itching.  Eyes:   Patient denies  blurred vision and double vision.  Ears/ Nose/ Throat:   Patient denies sore throat and sinus problems.  Hematologic/Lymphatic:   Patient denies swollen glands and easy bruising.  Cardiovascular:   Patient denies leg swelling and chest pains.  Respiratory:   Patient denies shortness of breath and cough.  Endocrine:   Patient denies excessive thirst.  Musculoskeletal:   Patient denies back pain and joint pain.   Neurological:   Patient denies headaches and dizziness.  Psychologic:   Patient denies depression and anxiety.   Notes: history of kidney stones , Pt is [redacted] weeks pregnant     VITAL SIGNS:      03/18/2020 11:22 AM  Weight 148 lb / 67.13 kg  Height 60 in / 152.4 cm  BP 121/77 mmHg  Heart Rate 96 /min  Temperature 97.8 F / 36.5 C  BMI 28.9 kg/m   GU PHYSICAL EXAMINATION:    Breast: Symmetrical. No tenderness, no nipple discharge, no skin changes. No mass.  Digital Rectal Exam: Normal sphincter tone. No rectal mass.  External Genitalia: No hirsutism, no rash, no scarring, no cyst, no erythematous lesion, no papular lesion, no blanched lesion, no warty lesion. No edema.  Urethral Meatus: Normal size. Normal position. No discharge.  Urethra: No tenderness, no mass, no scarring. No hypermobility. No leakage.  Bladder: Normal to palpation, no tenderness, no mass, normal size.  Vagina: No atrophy, no stenosis. No rectocele. No cystocele. No enterocele.  Cervix: No inflammation, no discharge, no lesion, no tenderness, no wart.  Uterus: Normal size. Normal consistency. Normal position. No mobility. No descent.  Adnexa / Parametria: No tenderness. No adnexal mass. Normal left ovary. Normal right ovary.  Anus and Perineum: No hemorrhoids. No anal stenosis. No rectal fissure, no anal fissure. No edema, no dimple, no perineal tenderness, no anal tenderness.   MULTI-SYSTEM PHYSICAL EXAMINATION:    Constitutional: Well-nourished. No physical deformities. Normally developed. Good grooming.  Neck: Neck symmetrical, not swollen. Normal tracheal position.  Respiratory: No labored breathing, no use of accessory muscles.   Cardiovascular: Normal temperature, normal extremity pulses, no swelling, no varicosities.  Lymphatic: No enlargement of neck, axillae, groin.  Skin: No paleness, no jaundice, no cyanosis. No lesion, no ulcer, no rash.  Neurologic / Psychiatric: Oriented to time, oriented to place,  oriented to person. No depression, no anxiety, no agitation.  Gastrointestinal: No mass, no tenderness, no rigidity, non obese abdomen.  Eyes: Normal conjunctivae. Normal eyelids.  Ears, Nose, Mouth, and Throat: Left ear no scars, no lesions, no masses. Right ear no scars, no lesions, no masses. Nose no scars, no lesions, no masses. Normal hearing. Normal lips.  Musculoskeletal: Normal gait and station of head and neck.     PAST DATA REVIEW: None   PROCEDURES:         Renal Ultrasound - 09604  Right Kidney: Length: 12.6 cm Depth: 7.0 cm Cortical Width: 1.6 cm Width: 4.8 cm  Left Kidney: Length: 11.4 cm Depth: 6.9 cm Cortical Width: 1.9 cm Width: 4.8 cm  Left Kidney/Ureter:  2 calcifications visualized.  Right Kidney/Ureter:  Mild hydro. Calcification MP  Bladder:  PVR 14.3 ml      Patient confirmed No Neulasta OnPro Device.   Renal ultrasound shows mild to moderate right-sided hydronephrosis with a 1 cm mid pole calcification. There are 2 calcifications noted within the left kidney, but no appreciable hydronephrosis. Smooth outline of the bladder with no evidence of debris or mass.  Urinalysis w/Scope Dipstick Dipstick Cont'd Micro  Color: Yellow Bilirubin: Neg mg/dL WBC/hpf: 0 - 5/hpf  Appearance: Cloudy Ketones: Neg mg/dL RBC/hpf: 40 - 36/UYQ  Specific Gravity: 1.020 Blood: 3+ ery/uL Bacteria: Few (10-25/hpf)  pH: 6.5 Protein: 1+ mg/dL Cystals: NS (Not Seen)  Glucose: Neg mg/dL Urobilinogen: 0.2 mg/dL Casts: NS (Not Seen)    Nitrites: Neg Trichomonas: Not Present    Leukocyte Esterase: Neg leu/uL Mucous: Present      Epithelial Cells: 0 - 5/hpf      Yeast: NS (Not Seen)      Sperm: Not Present    ASSESSMENT:      ICD-10 Details  1 GU:   Flank Pain - R10.84 Acute, Systemic Symptoms  2   Hydronephrosis - N13.0 Right, Acute, Systemic Symptoms  3   Acute Cystitis/UTI - N30.00 Undiagnosed New Problem  4   Renal calculus - N20.0 Bilateral, Acute, Systemic Symptoms    PLAN:           Orders Labs Urine Culture  X-Rays: Renal Ultrasound          Schedule Return Visit/Planned Activity: ASAP - Schedule Surgery          Document Letter(s):  Created for Talia M. Dayton Martes, MD   Created for Patient: Clinical Summary   Created for [facility]         Notes:   -Renal ultrasound today shows mild to moderate right-sided hydronephrosis, which could potentially be due to to an obstructing stone vs physiologic hydronephrosis of pregnancy. Given that her urinalysis shows signs of acute cystitis along with her history of kidney stones, right-sided ureteral stent is warranted.  -The risks, benefits and alternatives of cystoscopy with RIGHT JJ stent placement was discussed with the patient. Risks include, but are not limited to: bleeding, urinary tract infection, ureteral injury, ureteral stricture disease, chronic pain, urinary symptoms, bladder injury, stent migration, the need for nephrostomy tube placement, fetal loss, MI, CVA, DVT, PE and the inherent risks with general anesthesia. The patient voices understanding and wishes to proceed.

## 2020-03-18 NOTE — Progress Notes (Signed)
Pre-op NST completed. Patient reassured. Questions answered. No UC's, vaginal bleeding, or LOF. Patient reports active fetal movement.

## 2020-03-20 NOTE — Anesthesia Postprocedure Evaluation (Signed)
Anesthesia Post Note  Patient: Emily Lowe  Procedure(s) Performed: CYSTOSCOPY WITH STENT REPLACEMENT (Right Urethra)     Patient location during evaluation: PACU Anesthesia Type: General Level of consciousness: awake and alert Pain management: pain level controlled Vital Signs Assessment: post-procedure vital signs reviewed and stable Respiratory status: spontaneous breathing, nonlabored ventilation, respiratory function stable and patient connected to nasal cannula oxygen Cardiovascular status: blood pressure returned to baseline and stable Postop Assessment: no apparent nausea or vomiting Anesthetic complications: no    Last Vitals:  Vitals:   03/18/20 1800 03/18/20 1845  BP: 113/75 112/70  Pulse: (!) 109 (!) 108  Resp: 19 16  Temp:    SpO2: 97% 98%    Last Pain:  Vitals:   03/18/20 1845  TempSrc:   PainSc: 3                  Stephanine Reas

## 2020-03-29 ENCOUNTER — Other Ambulatory Visit: Payer: Self-pay | Admitting: Urology

## 2020-04-01 ENCOUNTER — Other Ambulatory Visit (HOSPITAL_COMMUNITY)
Admission: RE | Admit: 2020-04-01 | Discharge: 2020-04-01 | Disposition: A | Discharge status: Home / Self Care | Payer: Commercial Managed Care - PPO | Source: Ambulatory Visit | Attending: Urology | Admitting: Urology

## 2020-04-01 DIAGNOSIS — Z01812 Encounter for preprocedural laboratory examination: Secondary | ICD-10-CM | POA: Insufficient documentation

## 2020-04-01 DIAGNOSIS — Z20822 Contact with and (suspected) exposure to covid-19: Secondary | ICD-10-CM | POA: Diagnosis not present

## 2020-04-01 NOTE — Patient Instructions (Signed)
DUE TO COVID-19 ONLY ONE VISITOR IS ALLOWED TO COME WITH YOU AND STAY IN THE WAITING ROOM ONLY DURING PRE OP AND PROCEDURE DAY OF SURGERY. THE 1 VISITOR MAY VISIT WITH YOU AFTER SURGERY IN YOUR PRIVATE ROOM DURING VISITING HOURS ONLY!   ONCE YOUR COVID TEST IS COMPLETED, PLEASE BEGIN THE QUARANTINE INSTRUCTIONS AS OUTLINED IN YOUR HANDOUT.                Cherrie Gauze   Your procedure is scheduled on: 04/05/20   Report to [facility] Main  Entrance   Report to Short Stay at 5:30 AM     Call this number if you have problems the morning of surgery [phone]    Remember: Do not eat food or drink liquids :After Midnight.   BRUSH YOUR TEETH MORNING OF SURGERY AND RINSE YOUR MOUTH OUT, NO CHEWING GUM CANDY OR MINTS.     Take these medicines the morning of surgery with A SIP OF WATER: Oxybutynin, pepcid                                 You may not have any metal on your body including hair pins and              piercings  Do not wear jewelry, make-up, lotions, powders or perfumes, deodorant             Do not wear nail polish on your fingernails.  Do not shave  48 hours prior to surgery.     Do not bring valuables to the hospital. [place].  Contacts, dentures or bridgework may not be worn into surgery.      Patients discharged the day of surgery will not be allowed to drive home.   IF YOU ARE HAVING SURGERY AND GOING HOME THE SAME DAY, YOU MUST HAVE AN ADULT TO DRIVE YOU HOME AND BE WITH YOU FOR 24 HOURS.  YOU MAY GO HOME BY TAXI OR UBER OR ORTHERWISE, BUT AN ADULT MUST ACCOMPANY YOU HOME AND STAY WITH YOU FOR 24 HOURS.  Name and phone number of your driver:  Special Instructions: N/A              Please read over the following fact sheets you were given: _____________________________________________________________________             [facility] - Preparing for Surgery Before surgery, you can play an  important role.   Because skin is not sterile, your skin needs to be as free of germs as possible.   You can reduce the number of germs on your skin by washing with CHG (chlorahexidine gluconate) soap before surgery.   CHG is an antiseptic cleaner which kills germs and bonds with the skin to continue killing germs even after washing. Please DO NOT use if you have an allergy to CHG or antibacterial soaps.   If your skin becomes reddened/irritated stop using the CHG and inform your nurse when you arrive at Short Stay. Do not shave (including legs and underarms) for at least 48 hours prior to the first CHG shower.   Please follow these instructions carefully:  1.  Shower with CHG Soap the night before surgery and the  morning of Surgery.  2.  If you choose to wash your hair, wash  your hair first as usual with your  normal  shampoo.  3.  After you shampoo, rinse your hair and body thoroughly to remove the  shampoo.                                        4.  Use CHG as you would any other liquid soap.  You can apply chg directly  to the skin and wash                       Gently with a scrungie or clean washcloth.  5.  Apply the CHG Soap to your body ONLY FROM THE NECK DOWN.   Do not use on face/ open                           Wound or open sores. Avoid contact with eyes, ears mouth and genitals (private parts).                       Wash face,  Genitals (private parts) with your normal soap.             6.  Wash thoroughly, paying special attention to the area where your surgery  will be performed.  7.  Thoroughly rinse your body with warm water from the neck down.  8.  DO NOT shower/wash with your normal soap after using and rinsing off  the CHG Soap.             9.  Pat yourself dry with a clean towel.            10.  Wear clean pajamas.            11.  Place clean sheets on your bed the night of your first shower and do not  sleep with pets. Day of Surgery : Do not apply any lotions/deodorants  the morning of surgery.  Please wear clean clothes to the hospital/surgery center.  FAILURE TO FOLLOW THESE INSTRUCTIONS MAY RESULT IN THE CANCELLATION OF YOUR SURGERY PATIENT SIGNATURE_________________________________  NURSE SIGNATURE__________________________________  ________________________________________________________________________

## 2020-04-02 LAB — SARS CORONAVIRUS 2 (TAT 6-24 HRS): SARS Coronavirus 2: NEGATIVE

## 2020-04-04 ENCOUNTER — Other Ambulatory Visit: Payer: Self-pay

## 2020-04-04 ENCOUNTER — Encounter (HOSPITAL_COMMUNITY)
Admission: RE | Admit: 2020-04-04 | Discharge: 2020-04-04 | Disposition: A | Discharge status: Home / Self Care | Payer: Commercial Managed Care - PPO | Source: Ambulatory Visit | Attending: Urology | Admitting: Urology

## 2020-04-04 ENCOUNTER — Encounter (HOSPITAL_COMMUNITY): Payer: Self-pay

## 2020-04-04 DIAGNOSIS — Z01812 Encounter for preprocedural laboratory examination: Secondary | ICD-10-CM | POA: Diagnosis present

## 2020-04-04 HISTORY — DX: Dyspnea, unspecified: R06.00

## 2020-04-04 HISTORY — DX: Personal history of urinary calculi: Z87.442

## 2020-04-04 LAB — CBC
HCT: 27 % — ABNORMAL LOW (ref 36.0–46.0)
Hemoglobin: 8.9 g/dL — ABNORMAL LOW (ref 12.0–15.0)
MCH: 29.9 pg (ref 26.0–34.0)
MCHC: 33 g/dL (ref 30.0–36.0)
MCV: 90.6 fL (ref 80.0–100.0)
Platelets: 274 10*3/uL (ref 150–400)
RBC: 2.98 MIL/uL — ABNORMAL LOW (ref 3.87–5.11)
RDW: 12.6 % (ref 11.5–15.5)
WBC: 13.3 10*3/uL — ABNORMAL HIGH (ref 4.0–10.5)
nRBC: 0 % (ref 0.0–0.2)

## 2020-04-04 NOTE — Progress Notes (Addendum)
PCP - Dr. Orbie Pyo Cardiologist - none  Chest x-ray - no EKG - no Stress Test - no ECHO - no Cardiac Cath - no  Sleep Study - no CPAP -   Fasting Blood Sugar - NA Checks Blood Sugar _____ times a day  Blood Thinner Instructions:NA Aspirin Instructions: Last Dose:  Anesthesia review:   Patient denies shortness of breath, fever, cough and chest pain at PAT appointment  yes Patient verbalized understanding of instructions that were given to them at the PAT appointment. Patient was also instructed that they will need to review over the PAT instructions again at home before surgery. Yes  Pt is pregnant EDD 07/12/20. As of 5/17 she will be 24wks.  I called Dr. Eligha Bridegroom office to notify them of her surgery on 04/05/20 in order to arrange Fetal monitoring.  I talked with Hazel Sams  RN at [facility] rapid response 04/04/20 at 2:48 PM. She said that she will call the on call provider to get things set up for the pt by 5:30 AM in order to have  NST prior to procedure and for in PACU.post op.

## 2020-04-04 NOTE — Anesthesia Preprocedure Evaluation (Addendum)
Anesthesia Evaluation  Patient identified by MRN, date of birth, ID band Patient awake    Reviewed: Allergy & Precautions, NPO status , Patient's Chart, lab work & pertinent test results  Airway Mallampati: II  TM Distance: >3 FB Neck ROM: Full    Dental  (+) Teeth Intact, Dental Advisory Given   Pulmonary neg pulmonary ROS,    Pulmonary exam normal breath sounds clear to auscultation       Cardiovascular negative cardio ROS Normal cardiovascular exam Rhythm:Regular Rate:Normal     Neuro/Psych PSYCHIATRIC DISORDERS (OCD) Anxiety Depression negative neurological ROS     GI/Hepatic Neg liver ROS, GERD  Medicated and Controlled,  Endo/Other  negative endocrine ROS  Renal/GU Renal disease (RIGHT HYDRONEPHROSIS, RENAL CALCULI)     Musculoskeletal negative musculoskeletal ROS (+)   Abdominal   Peds  Hematology negative hematology ROS (+)   Anesthesia Other Findings Day of surgery medications reviewed with the patient.  Reproductive/Obstetrics (+) Pregnancy (26 weeks)                            Anesthesia Physical Anesthesia Plan  ASA: II  Anesthesia Plan: General   Post-op Pain Management:    Induction: Intravenous  PONV Risk Score and Plan: 4 or greater and Dexamethasone, Ondansetron, Treatment may vary due to age or medical condition and TIVA  Airway Management Planned: Oral ETT  Additional Equipment:   Intra-op Plan:   Post-operative Plan: Extubation in OR  Informed Consent: I have reviewed the patients History and Physical, chart, labs and discussed the procedure including the risks, benefits and alternatives for the proposed anesthesia with the patient or authorized representative who has indicated his/her understanding and acceptance.     Dental advisory given  Plan Discussed with: CRNA  Anesthesia Plan Comments: (Pre/post NST per OB order)      Anesthesia Quick  Evaluation

## 2020-04-05 ENCOUNTER — Inpatient Hospital Stay (EMERGENCY_DEPARTMENT_HOSPITAL)
Admission: AD | Admit: 2020-04-05 | Discharge: 2020-04-05 | Disposition: A | Discharge status: Home / Self Care | Payer: Commercial Managed Care - PPO | Source: Home / Self Care | Attending: Obstetrics and Gynecology | Admitting: Obstetrics and Gynecology

## 2020-04-05 ENCOUNTER — Encounter (HOSPITAL_COMMUNITY): Payer: Self-pay | Admitting: Obstetrics and Gynecology

## 2020-04-05 ENCOUNTER — Ambulatory Visit (HOSPITAL_COMMUNITY): Payer: Commercial Managed Care - PPO | Admitting: Anesthesiology

## 2020-04-05 ENCOUNTER — Ambulatory Visit (HOSPITAL_COMMUNITY): Payer: Commercial Managed Care - PPO

## 2020-04-05 ENCOUNTER — Encounter (HOSPITAL_COMMUNITY)
Admission: RE | Disposition: A | Discharge status: Home / Self Care | Payer: Self-pay | Source: Home / Self Care | Attending: Urology

## 2020-04-05 ENCOUNTER — Other Ambulatory Visit: Payer: Self-pay

## 2020-04-05 ENCOUNTER — Ambulatory Visit (HOSPITAL_COMMUNITY)
Admission: RE | Admit: 2020-04-05 | Discharge: 2020-04-05 | Disposition: A | Discharge status: Home / Self Care | Payer: Commercial Managed Care - PPO | Attending: Urology | Admitting: Urology

## 2020-04-05 DIAGNOSIS — R338 Other retention of urine: Secondary | ICD-10-CM | POA: Diagnosis not present

## 2020-04-05 DIAGNOSIS — O99891 Other specified diseases and conditions complicating pregnancy: Secondary | ICD-10-CM

## 2020-04-05 DIAGNOSIS — Z3A26 26 weeks gestation of pregnancy: Secondary | ICD-10-CM

## 2020-04-05 DIAGNOSIS — O26832 Pregnancy related renal disease, second trimester: Secondary | ICD-10-CM | POA: Insufficient documentation

## 2020-04-05 DIAGNOSIS — N200 Calculus of kidney: Secondary | ICD-10-CM | POA: Diagnosis present

## 2020-04-05 DIAGNOSIS — R339 Retention of urine, unspecified: Secondary | ICD-10-CM | POA: Diagnosis not present

## 2020-04-05 DIAGNOSIS — N9989 Other postprocedural complications and disorders of genitourinary system: Secondary | ICD-10-CM

## 2020-04-05 DIAGNOSIS — N132 Hydronephrosis with renal and ureteral calculous obstruction: Secondary | ICD-10-CM | POA: Diagnosis not present

## 2020-04-05 DIAGNOSIS — Z3A25 25 weeks gestation of pregnancy: Secondary | ICD-10-CM | POA: Diagnosis not present

## 2020-04-05 DIAGNOSIS — G40909 Epilepsy, unspecified, not intractable, without status epilepticus: Secondary | ICD-10-CM | POA: Insufficient documentation

## 2020-04-05 DIAGNOSIS — O99352 Diseases of the nervous system complicating pregnancy, second trimester: Secondary | ICD-10-CM | POA: Diagnosis not present

## 2020-04-05 DIAGNOSIS — Z79891 Long term (current) use of opiate analgesic: Secondary | ICD-10-CM | POA: Diagnosis not present

## 2020-04-05 DIAGNOSIS — Z79899 Other long term (current) drug therapy: Secondary | ICD-10-CM | POA: Insufficient documentation

## 2020-04-05 HISTORY — PX: CYSTOSCOPY/URETEROSCOPY/HOLMIUM LASER/STENT PLACEMENT: SHX6546

## 2020-04-05 SURGERY — CYSTOSCOPY/URETEROSCOPY/HOLMIUM LASER/STENT PLACEMENT
Anesthesia: General | Laterality: Bilateral

## 2020-04-05 MED ORDER — PROPOFOL 500 MG/50ML IV EMUL
INTRAVENOUS | Status: AC
  Filled 2020-04-05: qty 50

## 2020-04-05 MED ORDER — SODIUM CHLORIDE 0.9 % IV SOLN
INTRAVENOUS | Status: AC
  Filled 2020-04-05: qty 20

## 2020-04-05 MED ORDER — PROPOFOL 10 MG/ML IV BOLUS
INTRAVENOUS | Status: AC
  Filled 2020-04-05: qty 20

## 2020-04-05 MED ORDER — PROPOFOL 500 MG/50ML IV EMUL
INTRAVENOUS | Status: DC | PRN
Start: 2020-04-05 — End: 2020-04-05
  Administered 2020-04-05: 200 ug/kg/min via INTRAVENOUS
  Administered 2020-04-05: 150 ug/kg/min via INTRAVENOUS

## 2020-04-05 MED ORDER — LACTATED RINGERS IV SOLN: INTRAVENOUS | Status: DC

## 2020-04-05 MED ORDER — SUCCINYLCHOLINE CHLORIDE 200 MG/10ML IV SOSY
PREFILLED_SYRINGE | INTRAVENOUS | Status: DC | PRN
  Administered 2020-04-05: 100 mg via INTRAVENOUS

## 2020-04-05 MED ORDER — PROPOFOL 10 MG/ML IV BOLUS
INTRAVENOUS | Status: DC | PRN
  Administered 2020-04-05 (×2): 50 mg via INTRAVENOUS
  Administered 2020-04-05: 180 mg via INTRAVENOUS
  Administered 2020-04-05: 100 mg via INTRAVENOUS
  Administered 2020-04-05: 20 mg via INTRAVENOUS

## 2020-04-05 MED ORDER — FENTANYL CITRATE (PF) 100 MCG/2ML IJ SOLN
25.0000 ug | INTRAMUSCULAR | Status: DC | PRN
  Administered 2020-04-05: 25 ug via INTRAVENOUS
  Administered 2020-04-05: 50 ug via INTRAVENOUS
  Administered 2020-04-05: 25 ug via INTRAVENOUS

## 2020-04-05 MED ORDER — PROPOFOL 1000 MG/100ML IV EMUL
INTRAVENOUS | Status: AC
  Filled 2020-04-05: qty 100

## 2020-04-05 MED ORDER — LIDOCAINE 2% (20 MG/ML) 5 ML SYRINGE
INTRAMUSCULAR | Status: AC
  Filled 2020-04-05: qty 5

## 2020-04-05 MED ORDER — FENTANYL CITRATE (PF) 100 MCG/2ML IJ SOLN
INTRAMUSCULAR | Status: AC
  Filled 2020-04-05: qty 2

## 2020-04-05 MED ORDER — DEXAMETHASONE SODIUM PHOSPHATE 10 MG/ML IJ SOLN
INTRAMUSCULAR | Status: DC | PRN
  Administered 2020-04-05: 4 mg via INTRAVENOUS

## 2020-04-05 MED ORDER — FENTANYL CITRATE (PF) 100 MCG/2ML IJ SOLN
INTRAMUSCULAR | Status: DC | PRN
  Administered 2020-04-05: 25 ug via INTRAVENOUS
  Administered 2020-04-05: 75 ug via INTRAVENOUS
  Administered 2020-04-05 (×2): 50 ug via INTRAVENOUS

## 2020-04-05 MED ORDER — 0.9 % SODIUM CHLORIDE (POUR BTL) OPTIME
TOPICAL | Status: DC | PRN
  Administered 2020-04-05: 1000 mL

## 2020-04-05 MED ORDER — LIDOCAINE 2% (20 MG/ML) 5 ML SYRINGE
INTRAMUSCULAR | Status: DC | PRN
  Administered 2020-04-05: 60 mg via INTRAVENOUS

## 2020-04-05 MED ORDER — ONDANSETRON HCL 4 MG/2ML IJ SOLN
INTRAMUSCULAR | Status: DC | PRN
  Administered 2020-04-05: 4 mg via INTRAVENOUS

## 2020-04-05 MED ORDER — ONDANSETRON HCL 4 MG/2ML IJ SOLN: 4.0000 mg | Freq: Once | INTRAMUSCULAR | Status: DC | PRN

## 2020-04-05 MED ORDER — SODIUM CHLORIDE 0.9 % IR SOLN
Status: DC | PRN
  Administered 2020-04-05: 6000 mL

## 2020-04-05 MED ORDER — SCOPOLAMINE 1 MG/3DAYS TD PT72
1.0000 | MEDICATED_PATCH | Freq: Once | TRANSDERMAL | Status: DC
  Administered 2020-04-05: 06:00:00 1.5 mg via TRANSDERMAL
  Filled 2020-04-05: qty 1

## 2020-04-05 MED ORDER — OXYBUTYNIN CHLORIDE 5 MG PO TABS
5.0000 mg | ORAL_TABLET | Freq: Once | ORAL | Status: AC
  Administered 2020-04-05: 22:00:00 5 mg via ORAL
  Filled 2020-04-05: qty 1

## 2020-04-05 MED ORDER — ACETAMINOPHEN 500 MG PO TABS
1000.0000 mg | ORAL_TABLET | Freq: Once | ORAL | Status: AC
  Administered 2020-04-05: 1000 mg via ORAL
  Filled 2020-04-05: qty 2

## 2020-04-05 MED ORDER — CEPHALEXIN 250 MG PO CAPS: 250.0000 mg | ORAL_CAPSULE | Freq: Every day | ORAL | 0 refills | Status: AC

## 2020-04-05 MED ORDER — HYDROMORPHONE HCL 2 MG PO TABS
2.0000 mg | ORAL_TABLET | Freq: Once | ORAL | Status: AC
  Administered 2020-04-05: 2 mg via ORAL
  Filled 2020-04-05: qty 1

## 2020-04-05 MED ORDER — SUCCINYLCHOLINE CHLORIDE 200 MG/10ML IV SOSY
PREFILLED_SYRINGE | INTRAVENOUS | Status: AC
  Filled 2020-04-05: qty 10

## 2020-04-05 MED ORDER — DEXTROSE 5 % IV SOLN
INTRAVENOUS | Status: DC | PRN
  Administered 2020-04-05: 2 g via INTRAVENOUS

## 2020-04-05 SURGICAL SUPPLY — 24 items
BAG URO CATCHER STRL LF (MISCELLANEOUS) ×3 IMPLANT
BASKET ZERO TIP NITINOL 2.4FR (BASKET) ×2 IMPLANT
BSKT STON RTRVL ZERO TP 2.4FR (BASKET) ×1
CATH URET 5FR 28IN OPEN ENDED (CATHETERS) ×3 IMPLANT
CLOTH BEACON ORANGE TIMEOUT ST (SAFETY) ×3 IMPLANT
CNTNR URN SCR LID CUP LEK RST (MISCELLANEOUS) IMPLANT
CONT SPEC 4OZ STRL OR WHT (MISCELLANEOUS) ×3
DRSG TEGADERM 2-3/8X2-3/4 SM (GAUZE/BANDAGES/DRESSINGS) ×3 IMPLANT
EXTRACTOR STONE 1.7FRX115CM (UROLOGICAL SUPPLIES) IMPLANT
FIBER LASER TRAC TIP (UROLOGICAL SUPPLIES) ×2 IMPLANT
GLOVE BIO SURGEON STRL SZ 6.5 (GLOVE) ×2 IMPLANT
GLOVE BIO SURGEONS STRL SZ 6.5 (GLOVE) ×1
GOWN STRL REUS W/TWL LRG LVL3 (GOWN DISPOSABLE) ×3 IMPLANT
GUIDEWIRE STR DUAL SENSOR (WIRE) ×3 IMPLANT
KIT TURNOVER KIT A (KITS) IMPLANT
MANIFOLD NEPTUNE II (INSTRUMENTS) ×3 IMPLANT
SHEATH URETERAL 12FRX28CM (UROLOGICAL SUPPLIES) ×2 IMPLANT
SHEATH URETERAL 12FRX35CM (MISCELLANEOUS) IMPLANT
STENT URET 6FRX24 CONTOUR (STENTS) ×4 IMPLANT
TRAY CYSTO PACK (CUSTOM PROCEDURE TRAY) ×3 IMPLANT
TUBING CONNECTING 10 (TUBING) ×2 IMPLANT
TUBING CONNECTING 10' (TUBING) ×1
TUBING UROLOGY SET (TUBING) ×3 IMPLANT
WIRE COONS/BENSON .038X145CM (WIRE) IMPLANT

## 2020-04-05 NOTE — H&P (Signed)
cc: follow up after stent   03/24/20: 24 year old woman currently [redacted] weeks pregnant with bilateral renal calculi status post left ureteroscopy for 9 mm stone in March followed by right ureteral stent on 03/18/2020 with Dr. winter. No definitive ureteral stone was seen on the right side with repeat ultrasound however other is still unknown stone in the right kidney. It is unclear of right-sided hydronephrosis due to pregnancy or if there is a possible stone seen somewhere else. Patient returns today to discuss next steps. She is having some right hip and leg pain after the procedure.     ALLERGIES: No Known Drug Allergies    MEDICATIONS: Flomax  Hydromorphone Hcl 2 mg tablet 1 tablet PO Q 6 H PRN  Oxycodone Hcl  Phenergan  Prenatal Multivitamin  Tylenol 325 mg capsule     GU PSH: Ureteroscopic laser litho, Left - 01/26/2020     NON-GU PSH: None   GU PMH: Acute Cystitis/UTI - 03/18/2020 Flank Pain - 03/18/2020, - 02/01/2020 Hydronephrosis - 03/18/2020 Renal calculus - 03/18/2020 Ureteral calculus, Patient with obstructing left proximal ureteral calculus and intractable pain. This is complicated by the fact that the patient is in her 1st trimester of pregnancy. I discussed different management options which include observation, PCN tube placement by IR and ureteral stent placement. It is not safe to treat the stone in the 1st trimester pregnancy and would require stent changes every 4 weeks due to increased station again not ideal in the 1st trimester. I recommended percutaneous nephrostomy tube now placement with subsequent treatment of the stone in the 2nd trimester. IR was contacted. Update: Patient was not able to be pain controlled in the office. She will be sent to the hospital for further evaluation and management of her pain. I have contacted Interventional Radiology for PCN placement. - 12/14/2019 Urinary Calculus, Unspec    NON-GU PMH: Anxiety Obsessive-compulsive personality  disorder Seizure disorder    FAMILY HISTORY: Kidney Stones - Mother   SOCIAL HISTORY: Marital Status: Single Preferred Language: English; Ethnicity: Not Hispanic Or Latino; Race: White Current Smoking Status: Patient has never smoked.   Tobacco Use Assessment Completed: Used Tobacco in last 30 days? Does not drink anymore.  Drinks 1 caffeinated drink per day.    REVIEW OF SYSTEMS:    GU Review Female:   Patient reports get up at night to urinate. Patient denies burning /pain with urination, have to strain to urinate, stream starts and stops, hard to postpone urination, frequent urination, being pregnant, trouble starting your stream, and leakage of urine.  Gastrointestinal (Upper):   Patient reports nausea. Patient denies vomiting and indigestion/ heartburn.  Gastrointestinal (Lower):   Patient reports constipation. Patient denies diarrhea.  Constitutional:   Patient denies fever, night sweats, weight loss, and fatigue.  Skin:   Patient denies skin rash/ lesion and itching.  Eyes:   Patient denies blurred vision and double vision.  Ears/ Nose/ Throat:   Patient denies sore throat and sinus problems.  Hematologic/Lymphatic:   Patient denies swollen glands and easy bruising.  Cardiovascular:   Patient denies leg swelling and chest pains.  Respiratory:   Patient denies cough and shortness of breath.  Endocrine:   Patient denies excessive thirst.  Musculoskeletal:   Patient reports back pain. Patient denies joint pain.  Neurological:   Patient denies headaches and dizziness.  Psychologic:   Patient denies depression and anxiety.   Notes: lower back/hip pain right    VITAL SIGNS:      03/24/2020 08:49  AM  Weight 148 lb / 67.13 kg  Height 60 in / 152.4 cm  BP 107/75 mmHg  Pulse 99 /min  Temperature 98.2 F / 36.7 C  BMI 28.9 kg/m   MULTI-SYSTEM PHYSICAL EXAMINATION:    Constitutional: Well-nourished. No physical deformities. Normally developed. Good grooming.  Neck: Neck  symmetrical, not swollen. Normal tracheal position.  Respiratory: No labored breathing, no use of accessory muscles.   Skin: No paleness, no jaundice, no cyanosis. No lesion, no ulcer, no rash.  Neurologic / Psychiatric: Oriented to time, oriented to place, oriented to person. No depression, no anxiety, no agitation.  Eyes: Normal conjunctivae. Normal eyelids.  Ears, Nose, Mouth, and Throat: Left ear no scars, no lesions, no masses. Right ear no scars, no lesions, no masses. Nose no scars, no lesions, no masses. Normal hearing. Normal lips.  Musculoskeletal: Normal gait and station of head and neck.     Complexity of Data:  Records Review:   POC Tool  Urine Test Review:   Urinalysis  Notes:                     02/22/2020: Creatinine 0.3   PROCEDURES:          Urinalysis w/Scope Micro  WBC/hpf: 6 - 10/hpf  RBC/hpf: >60/hpf  Bacteria: Rare (0-9/hpf)  Cystals: NS (Not Seen)  Casts: NS (Not Seen)  Trichomonas: Not Present  Mucous: Not Present  Epithelial Cells: 0 - 5/hpf  Yeast: NS (Not Seen)  Sperm: Not Present    ASSESSMENT:      ICD-10 Details  1 GU:   Hydronephrosis - N13.0 Acute, Uncomplicated - Is unclear if patient's right-sided hydronephrosis is secondary to pregnancy or if there is an obstructing ureteral calculus. Repeat renal ultrasound showed stable nonobstructing right renal calculus. I discussed the risks and benefits of proceeding with cystoscopy and diagnostic ureteroscopy with the patient in the 2nd trimester versus waiting until she delivers. The patient wishes to proceed with surgery now. She understands the risks and benefits of this procedure and I will alert her OBGYN. Risks include bleeding, infection, damage to surrounding structures, possibility of finding no stone, inability removed renal calculus, pain, risks of anesthesia to both her and child. If she did decide to postpone surgery until after delivery would need to discuss possibility of exchanging ureteral  stent as it is at risk for encrustation during pregnancy. However she has decided that she would like to proceed with a diagnostic ureteroscopy.  2   Flank Pain - R10.84 Acute, Uncomplicated  3   Renal calculus - N20.0 Chronic, Worsening

## 2020-04-05 NOTE — Transfer of Care (Signed)
Immediate Anesthesia Transfer of Care Note  Patient: Emily Lowe  Procedure(s) Performed: CYSTOSCOPY/URETEROSCOPY/HOLMIUM LASER/STENT PLACEMENT (Bilateral )  Patient Location: PACU  Anesthesia Type:General  Level of Consciousness: awake, alert  and oriented  Airway & Oxygen Therapy: Patient Spontanous Breathing and Patient connected to face mask oxygen  Post-op Assessment: Report given to RN, Post -op Vital signs reviewed and stable and Patient moving all extremities X 4  Post vital signs: Reviewed and stable  Last Vitals:  Vitals Value Taken Time  BP 95/55 04/05/20 0827  Temp    Pulse 102 04/05/20 0828  Resp 29 04/05/20 0828  SpO2 100 % 04/05/20 0828  Vitals shown include unvalidated device data.  Last Pain:  Vitals:   04/05/20 0550  TempSrc: Oral         Complications: No apparent anesthesia complications

## 2020-04-05 NOTE — MAU Note (Signed)
PT SAYS SHE HAD STONES REMOVED TODAY . HAD 2 STENTS INSERTED .  VOIDED AT SURGERY- BUT NOW CANNOT VOID.  LAST VOID WAS  12 NOON. . NO C/O BABY.

## 2020-04-05 NOTE — MAU Note (Signed)
Pt not on monitor at 2205, artifact from monitor.

## 2020-04-05 NOTE — Discharge Instructions (Signed)
DISCHARGE INSTRUCTIONS FOR KIDNEY STONE/URETERAL STENT   MEDICATIONS:  1.  Resume all your other meds from home - except do not take any extra narcotic pain meds that you may have at home.  2. You may take tylenol up to 3000mg  in a 24 hour period to help with pain. (500mg  every 6 hours).    ACTIVITY:  1. No strenuous activity x 1week  2. No driving while on narcotic pain medications  3. Drink plenty of water  4. Continue to walk at home - you can still get blood clots when you are at home, so keep active, but don't over do it.  5. May return to work/school tomorrow or when you feel ready   BATHING:  1. You can shower and we recommend daily showers  2. You have a string coming from your urethra: The stent string is attached to your ureteral stent. Do not pull on this.   SIGNS/SYMPTOMS TO CALL:  Please call us if you have a fever greater than 101.5, uncontrolled nausea/vomiting, uncontrolled pain, dizziness, unable to urinate, bloody urine, chest pain, shortness of breath, leg swelling, leg pain, redness around wound, drainage from wound, or any other concerns or questions.   You can reach [place] at [phone].   FOLLOW-UP:  1. You have a string attached to your stents which will be removed in the office on Monday, May 17 at 11:30am. Please stay on low dose antibiotic cephalexin while the stents are in place.    General Anesthesia, Adult, Care After This sheet gives you information about how to care for yourself after your procedure. Your health care provider may also give you more specific instructions. If you have problems or questions, contact your health care provider. What can I expect after the procedure? After the procedure, the following side effects are common:  Pain or discomfort at the IV site.  Nausea.  Vomiting.  Sore throat.  Trouble concentrating.  Feeling cold or chills.  Weak or tired.  Sleepiness and fatigue.  Soreness and body aches. These side effects  can affect parts of the body that were not involved in surgery. Follow these instructions at home:  For at least 24 hours after the procedure:  Have a responsible adult stay with you. It is important to have someone help care for you until you are awake and alert.  Rest as needed.  Do not: ? Participate in activities in which you could fall or become injured. ? Drive. ? Use heavy machinery. ? Drink alcohol. ? Take sleeping pills or medicines that cause drowsiness. ? Make important decisions or sign legal documents. ? Take care of children on your own. Eating and drinking  Follow any instructions from your health care provider about eating or drinking restrictions.  When you feel hungry, start by eating small amounts of foods that are soft and easy to digest (bland), such as toast. Gradually return to your regular diet.  Drink enough fluid to keep your urine pale yellow.  If you vomit, rehydrate by drinking water, juice, or clear broth. General instructions  If you have sleep apnea, surgery and certain medicines can increase your risk for breathing problems. Follow instructions from your health care provider about wearing your sleep device: ? Anytime you are sleeping, including during daytime naps. ? While taking prescription pain medicines, sleeping medicines, or medicines that make you drowsy.  Return to your normal activities as told by your health care provider. Ask your health care provider what activities are  safe for you.  Take over-the-counter and prescription medicines only as told by your health care provider.  If you smoke, do not smoke without supervision.  Keep all follow-up visits as told by your health care provider. This is important. Contact a health care provider if:  You have nausea or vomiting that does not get better with medicine.  You cannot eat or drink without vomiting.  You have pain that does not get better with medicine.  You are unable to  pass urine.  You develop a skin rash.  You have a fever.  You have redness around your IV site that gets worse. Get help right away if:  You have difficulty breathing.  You have chest pain.  You have blood in your urine or stool, or you vomit blood. Summary  After the procedure, it is common to have a sore throat or nausea. It is also common to feel tired.  Have a responsible adult stay with you for the first 24 hours after general anesthesia. It is important to have someone help care for you until you are awake and alert.  When you feel hungry, start by eating small amounts of foods that are soft and easy to digest (bland), such as toast. Gradually return to your regular diet.  Drink enough fluid to keep your urine pale yellow.  Return to your normal activities as told by your health care provider. Ask your health care provider what activities are safe for you. This information is not intended to replace advice given to you by your health care provider. Make sure you discuss any questions you have with your health care provider. Document Revised: 11/15/2017 Document Reviewed: 06/28/2017 Elsevier Patient Education  2020 [employer].

## 2020-04-05 NOTE — Progress Notes (Signed)
G1P0 at 20 weeks in [facility] Short Stay here for cytoscopy, bilateral uteroscopy, laser lithotripsy and stent exchange due to kidney stones. Pre-op NST completed and appropriate for gestational age. Fetal movement palpated. Dr. Einar Grad aware of patient status.

## 2020-04-05 NOTE — Anesthesia Procedure Notes (Signed)
Procedure Name: Intubation Date/Time: 06/17/2020 7:34 AM Performed by: Niel Hummer, CRNA Pre-anesthesia Checklist: Patient identified, Emergency Drugs available, Suction available and Patient being monitored Patient Re-evaluated:Patient Re-evaluated prior to induction Oxygen Delivery Method: Circle system utilized Preoxygenation: Pre-oxygenation with 100% oxygen Induction Type: IV induction, Rapid sequence and Cricoid Pressure applied Laryngoscope Size: Mac and 4 Grade View: Grade I Tube type: Oral Tube size: 7.0 mm Number of attempts: 1 Airway Equipment and Method: Stylet Placement Confirmation: ETT inserted through vocal cords under direct vision,  positive ETCO2 and breath sounds checked- equal and bilateral Secured at: 22 cm Tube secured with: Tape Dental Injury: Teeth and Oropharynx as per pre-operative assessment

## 2020-04-05 NOTE — Anesthesia Postprocedure Evaluation (Signed)
Anesthesia Post Note  Patient: PAYTYN MESTA  Procedure(s) Performed: CYSTOSCOPY/URETEROSCOPY/HOLMIUM LASER/STENT PLACEMENT (Bilateral )     Patient location during evaluation: PACU Anesthesia Type: General Level of consciousness: awake and alert Pain management: pain level controlled Vital Signs Assessment: post-procedure vital signs reviewed and stable Respiratory status: spontaneous breathing, nonlabored ventilation, respiratory function stable and patient connected to nasal cannula oxygen Cardiovascular status: blood pressure returned to baseline and stable Postop Assessment: no apparent nausea or vomiting Anesthetic complications: no Comments: Post op NST completed and reviewed by OB-GYN    Last Vitals:  Vitals:   04/05/20 0930 04/05/20 1010  BP: (!) 104/56 108/64  Pulse: 95 79  Resp: 14 14  Temp: 36.8 C 36.8 C  SpO2: 96% 100%    Last Pain:  Vitals:   04/05/20 1010  TempSrc:   PainSc: 2                  Cecile Hearing

## 2020-04-05 NOTE — MAU Provider Note (Signed)
Chief Complaint:  No chief complaint on file.   First Provider Initiated Contact with Patient 04/05/20 2110     HPI: Emily Lowe is a 23 y.o. G1P0 at 59w0dwho presents to maternity admissions reporting acute urinary retention.  Had surgery this morning for lithotrypsy and stent placement.  Has not been able to urinate since 12 noon.  . She reports good fetal movement, denies LOF, vaginal bleeding, vaginal itching/burning, urinary symptoms, h/a, dizziness, n/v, diarrhea, constipation or fever/chills.  She denies headache, visual changes or RUQ abdominal pain.  Other This is a new (Urinary Retention) problem. The current episode started today. The problem occurs constantly. The problem has been unchanged. Associated symptoms include abdominal pain and urinary symptoms. Pertinent negatives include no chest pain, chills, congestion, fatigue, fever, myalgias or nausea. Treatments tried: was told to drink water but still unable to void.   RN Note: PT SAYS SHE HAD STONES REMOVED TODAY . HAD 2 STENTS INSERTED .  VOIDED AT SURGERY- BUT NOW CANNOT VOID.  LAST VOID WAS  12 NOON. . NO C/O BABY  Past Medical History: Past Medical History:  Diagnosis Date  . Anxiety   . Dyspnea   . History of kidney stones   . Kidney stones    followed by Dr. Achilles Dunk  . OCD (obsessive compulsive disorder)     Past obstetric history: OB History  Gravida Para Term Preterm AB Living  1            SAB TAB Ectopic Multiple Live Births               # Outcome Date GA Lbr Len/2nd Weight Sex Delivery Anes PTL Lv  1 Current             Past Surgical History: Past Surgical History:  Procedure Laterality Date  . CYSTOSCOPY W/ URETERAL STENT PLACEMENT Right 03/18/2020   Procedure: CYSTOSCOPY WITH STENT REPLACEMENT;  Surgeon: Rene Paci, MD;  Location: [facility];  Service: Urology;  Laterality: Right;  . CYSTOSCOPY/URETEROSCOPY/HOLMIUM LASER/STENT PLACEMENT Left 01/26/2020   Procedure:  CYSTOSCOPY/URETEROSCOPY/HOLMIUM LASER/STENT PLACEMENT/REMOVAL PERCUTANEOUS NEPHROSTOMY TUBE LEFT;  Surgeon: Noel Christmas, MD;  Location: [facility];  Service: Urology;  Laterality: Left;  90 MINS  . IR NEPHROSTOMY PLACEMENT LEFT  12/15/2019  . WISDOM TOOTH EXTRACTION      Family History: Family History  Problem Relation Age of Onset  . Hypertension Mother   . Gout Father     Social History: Social History   Tobacco Use  . Smoking status: Never Smoker  . Smokeless tobacco: Never Used  Substance Use Topics  . Alcohol use: Not Currently    Comment: Occas.  . Drug use: No    Allergies: No Known Allergies  Meds:  Medications Prior to Admission  Medication Sig Dispense Refill Last Dose  . acetaminophen (TYLENOL) 500 MG tablet Take 500 mg by mouth every 6 (six) hours as needed for moderate pain.    04/05/2020 at Unknown time  . cephALEXin (KEFLEX) 250 MG capsule Take 1 capsule (250 mg total) by mouth daily for 7 days. 7 capsule 0 04/05/2020 at Unknown time  . famotidine (PEPCID) 20 MG tablet Take 20 mg by mouth daily as needed for heartburn or indigestion.   Past Week at Unknown time  . HYDROmorphone (DILAUDID) 2 MG tablet Take 1 tablet (2 mg total) by mouth every 6 (six) hours as needed for moderate pain. 15 tablet 0 04/05/2020 at Unknown time  . oxybutynin (DITROPAN) 5  MG tablet Take 1 tablet (5 mg total) by mouth every 8 (eight) hours as needed for bladder spasms. 30 tablet 1 04/05/2020 at Unknown time  . Prenatal Vit-Fe Fumarate-FA (PRENATAL MULTIVITAMIN) TABS tablet Take 1 tablet by mouth daily at 12 noon.   Past Week at Unknown time  . promethazine (PHENERGAN) 12.5 MG tablet Take 1 tablet (12.5 mg total) by mouth every 6 (six) hours as needed for nausea or vomiting. 30 tablet 0 Past Week at Unknown time  . metoCLOPramide (REGLAN) 10 MG tablet Take 10 mg by mouth every 6 (six) hours as needed for nausea or vomiting.    More than a month at Unknown time    I have reviewed patient's  Past Medical Hx, Surgical Hx, Family Hx, Social Hx, medications and allergies.   ROS:  Review of Systems  Constitutional: Negative for chills, fatigue and fever.  HENT: Negative for congestion.   Cardiovascular: Negative for chest pain.  Gastrointestinal: Positive for abdominal pain. Negative for nausea.  Musculoskeletal: Negative for myalgias.   Other systems negative  Physical Exam   Patient Vitals for the past 24 hrs:  BP Temp Pulse Resp Height Weight  04/05/20 2051 117/74 98.6 F (37 C) 89 20 5' (1.524 m) 70.5 kg   Constitutional: Well-developed, well-nourished female in considerable pain .  Cardiovascular: normal rate and rhythm Respiratory: normal effort, clear to auscultation bilaterally GI: Abd soft, non-tender except over suprapubic area, gravid appropriate for gestational age.   No rebound or guarding. MS: Extremities nontender, no edema, normal ROM Neurologic: Alert and oriented x 4.  GU: Neg CVAT.  PELVIC EXAM: deferred.  Urethral stent protruding from urethral meatus    FHT:  Baseline 140 , moderate variability, accelerations present, no decelerations, very reactive considering gestational age. Contractions:  Rare   Labs: No results found for this or any previous visit (from the past 24 hour(s)).  --/--/B POS (01/18 1550)  Imaging:  Bladder scan done prior to catheterization:  650ml  MAU Course/MDM: NST reviewed, reactive Consult [facility] (APP with [facility]) with presentation, exam findings.  She recommends insertion of Foley catheter and discharge home.  They will see her in the office in the morning for removal.  Treatments in MAU included Careful insertion of indwelling Foley Catheter.  Secured to leg.  Return of 1233ml urine.  Instructed FOB in how to empty bag. .    Assessment: Single IUP at [redacted]w[redacted]d S/P Cystoscopy right ureteroscopy, laser lithotripsy, stone removal Left diagnostic ureteroscopy         bilateral 70F x 24cm   ureteral stent placement with string Acute Urinary Retention  Plan: Discharge home Indwelling urinary catheter left in situ Follow up in [facility] Office in the morning.  Encouraged to return here or to other Urgent Care/ED if she develops worsening of symptoms, increase in pain, fever, or other concerning symptoms.   Pt stable at time of discharge.  Hansel Feinstein CNM, MSN Certified Nurse-Midwife 04/05/2020 9:24 PM

## 2020-04-05 NOTE — Op Note (Signed)
Preoperative diagnosis: biateral renal calculi  Postoperative diagnosis: bilateral renal calculi  Procedure:  1. Cystoscopy 2. right ureteroscopy, laser lithotripsy, stone removal 3. Left diagnostic ureteroscopy 4. bilateral 72F x 24cm  ureteral stent placement with string  Surgeon: Jacalyn Lefevre, MD  Anesthesia: General  Complications: None  Intraoperative findings:  1. Encrusted right ureteral stent removed 2. Proximal ureteral calculus fragmented 3. No ureteral stone on left   EBL: Minimal  Specimens: 1. Right ureteral calculus  Disposition of specimens: [facility] for stone analysis  Indication: Emily Lowe is a 23 y.o.   Women currently pregnant in her second trimester with right flank pain who underwent right ureteral stent placement and now returns for definitive stone management.  Renal ultrasound showed an 8 mm right renal calculus and 2.9 mm left renal calculus.  After reviewing the management options for treatment, the patient elected to proceed with the above surgical procedure(s). We have discussed the potential benefits and risks of the procedure, side effects of the proposed treatment, the likelihood of the patient achieving the goals of the procedure, and any potential problems that might occur during the procedure or recuperation. Informed consent has been obtained.   Description of procedure:  The patient was taken to the operating room and general anesthesia was induced.  The patient was placed in the dorsal lithotomy position, prepped and draped in the usual sterile fashion, and preoperative antibiotics were administered. A preoperative time-out was performed.   Cystourethroscopy was performed.  The patient's urethra was examined and was normal.  The bladder was then systematically examined in its entirety. There was no evidence for any bladder tumors, stones, or other mucosal pathology.  The existing right ureteral stent was seen  emanating from the right ureteral orifice.  It was grasped with a grasper and brought to the urethral meatus.  The sensor wire was not able to be placed through the existing stent due to stent encrustation.  The stent was then removed and noted to be significantly encrusted.  Attention then turned to the right ureteral orifice and a 0.38 sensor guidewire was then advanced up the right ureter into the renal pelvis until resistance was met.  Fluoroscopy was limited due to patient being pregnant.  Next, the 6 Fr semirigid ureteroscope was then advanced into the ureter next to the guidewire and the calculus was identified. The stone was then fragmented with the 255mcron laser fiber.  The stone fragments were then basketed.  A second sensor wire was placed through the ureteroscope and the ureteroscope was removed.  A ureteral access sheath was placed over the working wire and advanced to the kidney.  The inner sheath and wire removed.  Flexible ureteroscopy then took place and some smaller stone fragments were seen in the kidney.  These were all basketed.  The access sheath was removed and unison with the ureteroscope taking care to inspect the ureter on the way out. No remaining visible stones or fragments.   The wire was then backloaded through the cystoscope and a ureteral stent was advance over the wire using Seldinger technique.  The stent was positioned appropriately under cystoscopic guidance.  The wire was then removed with an adequate stent curl noted in the bladder.  Attention then turned to the left side.  The left ureteral orifice was cannulated with a sensor wire and advanced to the kidney.  A second wire was placed in a similar manner.  Attempts at placing the ureteral access sheath were unsuccessful.  The inner sheath was able to be placed easily however the ureter was too tight to accommodate the outer sheath.  Semirigid ureteroscopy then took place along the wire to the UPJ.  No stones were  encountered.  The ureteroscope was removed and the stent on the left side was placed in a similar manner to the right.  The bladder was then emptied and the procedure ended.  The patient appeared to tolerate the procedure well and without complications.  The patient was able to be awakened and transferred to the recovery unit in satisfactory condition.   Disposition: The tether of the stents were tucked inside the patient's vagina.  Patient will have stents removed in the office next week.

## 2020-04-05 NOTE — Progress Notes (Signed)
Post op NST completed for this 24 yo G1P0 @ [redacted] wks GA in for bilateral ureteral stent placement and lithotripsy.  FHR Category I and appropriate for GA.  No UC's noted.  Pt does not have vaginal bleeding, LOF or report of UC's. Pt reports feeling fetal movement. 3010 Dr. Jackelyn Knife updated. No further orders.

## 2020-04-05 NOTE — Discharge Instructions (Signed)
Acute Urinary Retention, Female  Acute urinary retention is a condition in which a person is unable to pass urine. This can last for a short time or for a long time. If left untreated, it can result in kidney damage or other serious complications. What are the causes? This condition may be caused by:  Obstruction or narrowing of the tube that drains the bladder (urethra). This may be caused by surgery or problems with nearby organs, which can press or squeeze the urethra.  Problems with the nerves in the bladder. These can be caused by diseases, such as multiple sclerosis, or by spinal cord injuries.  Certain medicines.  Tumors in the area of the pelvis, bladder, or urethra.  Degenerative cognitive conditions such as delirium or dementia.  Diabetes.  Vaginal childbirth.  Bladder or urinary tract infection.  Constipation.  Blood in the urine (hematuria).  Injury to the bladder or urethra.  Psychological (psychogenic) conditions. Someone may hold her urine due to trauma or because she does not want to use the bathroom. What are the signs or symptoms? Symptoms of this condition include:  Trouble urinating.  Pain in the lower abdomen. How is this diagnosed? This condition is diagnosed based on a physical exam and a medical history. You may also have other tests, including:  An ultrasound of the bladder or kidneys or both.  Blood tests.  A urine analysis.  Additional tests may be needed such as an MRI, kidney, or bladder function tests. How is this treated? Treatment for this condition may include:  Medicines.  Placing a thin, sterile tube (catheter) into the bladder to drain urine out of the body. This is called an indwelling urinary catheter. After being inserted, the catheter is held in place with a small balloon that is filled with sterile water. Urine drains from the catheter into a collection bag outside of the body.  Behavioral therapy.  Treatment for any  underlying conditions.  If needed, you may be treated in the hospital for kidney function problems or to manage other complications. Follow these instructions at home:  Take over-the-counter and prescription medicines only as told by your health care provider. Avoid certain medicines, such as decongestants, antihistamines, and some prescription medicines. Do not take any medicine unless your health care provider has approved.  If you were given an indwelling urinary catheter, take care of it as told by your health care provider.  Drink enough fluid to keep your urine clear or pale yellow.  If you were prescribed an antibiotic, take it as told by your health care provider. Do not stop taking the antibiotic even if you start to feel better.  Do not use any products that contain nicotine or tobacco, such as cigarettes and e-cigarettes. If you need help quitting, ask your health care provider.  Monitor any changes in your symptoms. Tell your health care provider about any changes.  If instructed, monitor your blood pressure at home. Report changes as told by your health care provider.  Keep all follow-up visits as told by your health care provider. This is important. Contact a health care provider if:  You have uncomfortable bladder contractions that you cannot control (spasms) or you leak urine with the spasms. Get help right away if:  You have chills or fever.  You have blood in your urine.  You have a catheter and: ? Your catheter stops draining urine. ? Your catheter falls out. Summary  Acute urinary retention is a condition in which a person   is unable to pass urine. If left untreated, it can result in kidney damage or other serious complications.  This condition may be caused by surgery or problems with nearby organs, which can press or squeeze the urethra.  Treatment may include medicines and placement of an indwelling urinary catheter.  Monitor any changes in your symptoms.  Tell your health care provider about any changes. This information is not intended to replace advice given to you by your health care provider. Make sure you discuss any questions you have with your health care provider. Document Revised: 10/25/2017 Document Reviewed: 12/14/2016 Elsevier Patient Education  2020 Elsevier Inc.  

## 2020-04-14 LAB — OB RESULTS CONSOLE HIV ANTIBODY (ROUTINE TESTING): HIV: NONREACTIVE

## 2020-05-03 ENCOUNTER — Telehealth: Payer: Self-pay | Admitting: Hematology

## 2020-05-03 NOTE — Telephone Encounter (Signed)
Received a new hem referral from Dr. Mindi Slicker for anemia in pregancy. Pt has been cld and scheduled to see Dr. Candise Che on 6/9 at 11am. Pt aware to arrive 15 minutes early.

## 2020-05-04 ENCOUNTER — Inpatient Hospital Stay: Payer: Commercial Managed Care - PPO | Attending: Hematology | Admitting: Hematology

## 2020-05-04 ENCOUNTER — Other Ambulatory Visit: Payer: Self-pay

## 2020-05-04 ENCOUNTER — Telehealth: Payer: Self-pay | Admitting: Hematology

## 2020-05-04 ENCOUNTER — Inpatient Hospital Stay: Payer: Commercial Managed Care - PPO

## 2020-05-04 VITALS — BP 124/87 | HR 110 | Temp 97.5°F | Resp 18 | Ht 60.0 in | Wt 158.1 lb

## 2020-05-04 DIAGNOSIS — E538 Deficiency of other specified B group vitamins: Secondary | ICD-10-CM

## 2020-05-04 DIAGNOSIS — R5383 Other fatigue: Secondary | ICD-10-CM | POA: Insufficient documentation

## 2020-05-04 DIAGNOSIS — F429 Obsessive-compulsive disorder, unspecified: Secondary | ICD-10-CM | POA: Diagnosis not present

## 2020-05-04 DIAGNOSIS — Z87442 Personal history of urinary calculi: Secondary | ICD-10-CM | POA: Insufficient documentation

## 2020-05-04 DIAGNOSIS — Z79899 Other long term (current) drug therapy: Secondary | ICD-10-CM | POA: Diagnosis not present

## 2020-05-04 DIAGNOSIS — F419 Anxiety disorder, unspecified: Secondary | ICD-10-CM | POA: Diagnosis not present

## 2020-05-04 DIAGNOSIS — D509 Iron deficiency anemia, unspecified: Secondary | ICD-10-CM | POA: Diagnosis present

## 2020-05-04 DIAGNOSIS — O99013 Anemia complicating pregnancy, third trimester: Secondary | ICD-10-CM | POA: Diagnosis not present

## 2020-05-04 LAB — CBC WITH DIFFERENTIAL/PLATELET
Abs Immature Granulocytes: 0.65 10*3/uL — ABNORMAL HIGH (ref 0.00–0.07)
Basophils Absolute: 0.1 10*3/uL (ref 0.0–0.1)
Basophils Relative: 1 %
Eosinophils Absolute: 0.1 10*3/uL (ref 0.0–0.5)
Eosinophils Relative: 1 %
HCT: 27.7 % — ABNORMAL LOW (ref 36.0–46.0)
Hemoglobin: 9.1 g/dL — ABNORMAL LOW (ref 12.0–15.0)
Immature Granulocytes: 5 %
Lymphocytes Relative: 13 %
Lymphs Abs: 1.6 10*3/uL (ref 0.7–4.0)
MCH: 28.9 pg (ref 26.0–34.0)
MCHC: 32.9 g/dL (ref 30.0–36.0)
MCV: 87.9 fL (ref 80.0–100.0)
Monocytes Absolute: 1 10*3/uL (ref 0.1–1.0)
Monocytes Relative: 8 %
Neutro Abs: 9.4 10*3/uL — ABNORMAL HIGH (ref 1.7–7.7)
Neutrophils Relative %: 72 %
Platelets: 220 10*3/uL (ref 150–400)
RBC: 3.15 MIL/uL — ABNORMAL LOW (ref 3.87–5.11)
RDW: 13.5 % (ref 11.5–15.5)
WBC: 12.9 10*3/uL — ABNORMAL HIGH (ref 4.0–10.5)
nRBC: 0 % (ref 0.0–0.2)

## 2020-05-04 LAB — IRON AND TIBC
Iron: 55 ug/dL (ref 41–142)
Saturation Ratios: 10 % — ABNORMAL LOW (ref 21–57)
TIBC: 523 ug/dL — ABNORMAL HIGH (ref 236–444)
UIBC: 468 ug/dL — ABNORMAL HIGH (ref 120–384)

## 2020-05-04 LAB — CMP (CANCER CENTER ONLY)
ALT: 12 U/L (ref 0–44)
AST: 11 U/L — ABNORMAL LOW (ref 15–41)
Albumin: 2.8 g/dL — ABNORMAL LOW (ref 3.5–5.0)
Alkaline Phosphatase: 80 U/L (ref 38–126)
Anion gap: 9 (ref 5–15)
BUN: 8 mg/dL (ref 6–20)
CO2: 24 mmol/L (ref 22–32)
Calcium: 9.5 mg/dL (ref 8.9–10.3)
Chloride: 107 mmol/L (ref 98–111)
Creatinine: 0.65 mg/dL (ref 0.44–1.00)
GFR, Est AFR Am: >60 mL/min (ref >60)
GFR, Estimated: >60 mL/min (ref >60)
Glucose, Bld: 75 mg/dL (ref 70–99)
Potassium: 3.9 mmol/L (ref 3.5–5.1)
Sodium: 140 mmol/L (ref 135–145)
Total Bilirubin: 0.2 mg/dL — ABNORMAL LOW (ref 0.3–1.2)
Total Protein: 6.7 g/dL (ref 6.5–8.1)

## 2020-05-04 LAB — VITAMIN B12: Vitamin B-12: 126 pg/mL — ABNORMAL LOW (ref 180–914)

## 2020-05-04 LAB — FERRITIN: Ferritin: 14 ng/mL (ref 11–307)

## 2020-05-04 MED ORDER — POLYSACCHARIDE IRON COMPLEX 150 MG PO CAPS
150.0000 mg | ORAL_CAPSULE | Freq: Two times a day (BID) | ORAL | 5 refills | Status: DC
Start: 2020-05-04 — End: 2022-02-16

## 2020-05-04 NOTE — Telephone Encounter (Signed)
Scheduled per 6/9 los. Printed avs for pt.

## 2020-05-04 NOTE — Progress Notes (Addendum)
HEMATOLOGY/ONCOLOGY CONSULTATION NOTE  Date of Service: 05/04/2020  Patient Care Team: Dianne Dun, MD as PCP - General (Family Medicine) Noel Christmas, MD as Consulting Physician (Urology)  CHIEF COMPLAINTS/PURPOSE OF CONSULTATION:  Anemia in pregnancy  HISTORY OF PRESENTING ILLNESS:   Emily Lowe is a wonderful 24 y.o. female who has been referred to [place] by Dr Mindi Slicker for evaluation and management of anemia in pregnancy. The pt reports that she is doing well overall.   The pt reports that she is currently [redacted] weeks pregnant and was referred to [place] by her OBGYN who was concerned about her increasing anemia. Pt has had no concerns for anemia or iron deficiency prior to her pregnancy. This is her first pregnancy. Pt was started on OTC PO Ferrous Sulfate about two weeks ago. She takes one a day with food. She experiences a burning sensation in her stomach after she takes Iron and has had significant constipation since starting the supplement. Pt has not used any stool softeners or laxatives to correct her constipation. She has a history of kidney stones still has occasional discomfort from this, but denies any hematuria. Pt has been eating well, gaining weight appropriately, and taking her prenatal vitamins daily. She has no known medication allergies and no family history of blood disorders. Pt does not smoke and is not drinking any alcohol.   Most recent lab results (04/28/2020) of CBC w/diff is as follows: all values are WNL except for WBC at 15.0K, RBC at 3.02, Hgb at 8.9, HCT at 26.2, Neutro Abs at 11.1K, Mono abs at 1.1K, Abs Immature Granulocytes at 0.7K. 04/28/2020 Iron Panel is as follows: TIBC at 513, UIBC at 383, Iron Sat at 25, Iron at 130 04/28/2020 Ferritin at 25  On review of systems, pt reports constipation, fatigue and denies nose bleeds, gum bleeds, bloody/black stools, hematuria, lightheadedness, dizziness, fevers, chills, night sweats, abdominal pain, leg swelling and  any other symptoms.   On PMHx the pt reports Kidney Stones, OCD, Anxiety. On Social Hx the pt reports she is a non-smoker and does not drink alcohol.   MEDICAL HISTORY:  Past Medical History:  Diagnosis Date  . Anxiety   . Dyspnea   . History of kidney stones   . Kidney stones    followed by Dr. Achilles Dunk  . OCD (obsessive compulsive disorder)     SURGICAL HISTORY: Past Surgical History:  Procedure Laterality Date  . CYSTOSCOPY W/ URETERAL STENT PLACEMENT Right 03/18/2020   Procedure: CYSTOSCOPY WITH STENT REPLACEMENT;  Surgeon: Rene Paci, MD;  Location: [facility];  Service: Urology;  Laterality: Right;  . CYSTOSCOPY/URETEROSCOPY/HOLMIUM LASER/STENT PLACEMENT Left 01/26/2020   Procedure: CYSTOSCOPY/URETEROSCOPY/HOLMIUM LASER/STENT PLACEMENT/REMOVAL PERCUTANEOUS NEPHROSTOMY TUBE LEFT;  Surgeon: Noel Christmas, MD;  Location: [facility];  Service: Urology;  Laterality: Left;  90 MINS  . CYSTOSCOPY/URETEROSCOPY/HOLMIUM LASER/STENT PLACEMENT Bilateral 04/05/2020   Procedure: CYSTOSCOPY/URETEROSCOPY/HOLMIUM LASER/STENT PLACEMENT;  Surgeon: Noel Christmas, MD;  Location: [facility];  Service: Urology;  Laterality: Bilateral;  90 MINS  . IR NEPHROSTOMY PLACEMENT LEFT  12/15/2019  . WISDOM TOOTH EXTRACTION      SOCIAL HISTORY: Social History   Socioeconomic History  . Marital status: Single    Spouse name: Not on file  . Number of children: Not on file  . Years of education: Not on file  . Highest education level: Not on file  Occupational History  . Not on file  Tobacco Use  . Smoking status: Never Smoker  .  Smokeless tobacco: Never Used  Substance and Sexual Activity  . Alcohol use: Not Currently    Comment: Occas.  . Drug use: No  . Sexual activity: Not Currently    Partners: Male  Other Topics Concern  . Not on file  Social History Narrative   Graduated from high school in 2014.  Going to [employer] in fall.  Physics major.  Thinking about engineering.   Social  Determinants of Health   Financial Resource Strain:   . Difficulty of Paying Living Expenses:   Food Insecurity:   . Worried About Programme researcher, broadcasting/film/video in the Last Year:   . Barista in the Last Year:   Transportation Needs:   . Freight forwarder (Medical):   Marland Kitchen Lack of Transportation (Non-Medical):   Physical Activity:   . Days of Exercise per Week:   . Minutes of Exercise per Session:   Stress:   . Feeling of Stress :   Social Connections:   . Frequency of Communication with Friends and Family:   . Frequency of Social Gatherings with Friends and Family:   . Attends Religious Services:   . Active Member of Clubs or Organizations:   . Attends Banker Meetings:   Marland Kitchen Marital Status:   Intimate Partner Violence:   . Fear of Current or Ex-Partner:   . Emotionally Abused:   Marland Kitchen Physically Abused:   . Sexually Abused:     FAMILY HISTORY: Family History  Problem Relation Age of Onset  . Hypertension Mother   . Gout Father     ALLERGIES:  has No Known Allergies.  MEDICATIONS:  Current Outpatient Medications  Medication Sig Dispense Refill  . acetaminophen (TYLENOL) 500 MG tablet Take 500 mg by mouth every 6 (six) hours as needed for moderate pain.     . famotidine (PEPCID) 20 MG tablet Take 20 mg by mouth daily as needed for heartburn or indigestion.    Marland Kitchen HYDROmorphone (DILAUDID) 2 MG tablet Take 1 tablet (2 mg total) by mouth every 6 (six) hours as needed for moderate pain. 15 tablet 0  . metoCLOPramide (REGLAN) 10 MG tablet Take 10 mg by mouth every 6 (six) hours as needed for nausea or vomiting.     Marland Kitchen oxybutynin (DITROPAN) 5 MG tablet Take 1 tablet (5 mg total) by mouth every 8 (eight) hours as needed for bladder spasms. 30 tablet 1  . Prenatal Vit-Fe Fumarate-FA (PRENATAL MULTIVITAMIN) TABS tablet Take 1 tablet by mouth daily at 12 noon.    . promethazine (PHENERGAN) 12.5 MG tablet Take 1 tablet (12.5 mg total) by mouth every 6 (six) hours as needed for  nausea or vomiting. 30 tablet 0  . iron polysaccharides (NIFEREX) 150 MG capsule Take 1 capsule (150 mg total) by mouth 2 (two) times daily. 60 capsule 5   No current facility-administered medications for this visit.    REVIEW OF SYSTEMS:    10 Point review of Systems was done is negative except as noted above.  PHYSICAL EXAMINATION: ECOG PERFORMANCE STATUS: 1 - Symptomatic but completely ambulatory  . Vitals:   05/04/20 1122  BP: 124/87  Pulse: (!) 110  Resp: 18  Temp: (!) 97.5 F (36.4 C)  SpO2: 100%   Filed Weights   05/04/20 1122  Weight: 158 lb 1.6 oz (71.7 kg)   .Body mass index is 30.88 kg/m.  GENERAL:alert, in no acute distress and comfortable SKIN: no acute rashes, no significant lesions EYES: conjunctiva are  pink and non-injected, sclera anicteric OROPHARYNX: MMM, no exudates, no oropharyngeal erythema or ulceration NECK: supple, no JVD LYMPH:  no palpable lymphadenopathy in the cervical, axillary or inguinal regions LUNGS: clear to auscultation b/l with normal respiratory effort HEART: regular rate & rhythm ABDOMEN:  normoactive bowel sounds , non tender, not distended. Extremity: no pedal edema PSYCH: alert & oriented x 3 with fluent speech NEURO: no focal motor/sensory deficits  LABORATORY DATA:  I have reviewed the data as listed  . CBC Latest Ref Rng & Units 05/04/2020 04/04/2020 01/21/2020  WBC 4.0 - 10.5 K/uL 12.9(H) 13.3(H) 13.0(H)  Hemoglobin 12.0 - 15.0 g/dL 1.0(U) 8.9(L) 11.1(L)  Hematocrit 36.0 - 46.0 % 27.7(L) 27.0(L) 33.1(L)  Platelets 150 - 400 K/uL 220 274 244    . CMP Latest Ref Rng & Units 05/04/2020 12/14/2019 09/24/2017  Glucose 70 - 99 mg/dL 75 725(D) 83  BUN 6 - 20 mg/dL 8 10 8   Creatinine 0.44 - 1.00 mg/dL 6.64 4.03  Sodium 135 - 145 mmol/L 140 135 142  Potassium 3.5 - 5.1 mmol/L 3.9 4.1 4.1  Chloride 98 - 111 mmol/L 107 101 107  CO2 22 - 32 mmol/L 24 24 27   Calcium 8.9 - 10.3 mg/dL 9.5 9.5 9.5  Total Protein 6.5 - 8.1  g/dL 6.7 7.1 6.9  Total Bilirubin 0.3 - 1.2 mg/dL 4.74) 0.7 0.4  Alkaline Phos 38 - 126 U/L 80 34(L) 32(L)  AST 15 - 41 U/L 11(L) 23 16  ALT 0 - 44 U/L 12 20 15      RADIOGRAPHIC STUDIES: I have personally reviewed the radiological images as listed and agreed with the findings in the report. DG C-Arm 1-60 Min-No Report  Result Date: 04/05/2020 Fluoroscopy was utilized by the requesting physician.  No radiographic interpretation.    ASSESSMENT & PLAN:   24 yo with   1) Anemia during 3rd trimester of pregnancy Concerning for iron deficiency PLAN: -Discussed patient's most recent labs from 04/28/2020, all values are WNL except for WBC at 15.0K, RBC at 3.02, Hgb at 8.9, HCT at 26.2, Neutro Abs at 11.1K, Mono abs at 1.1K, Abs Immature Granulocytes at 0.7K. -Discussed 04/28/2020 Iron Panel is as follows: TIBC at 513, UIBC at 383, Iron Sat at 25, Iron at 130 -Discussed 04/28/2020 Ferritin at 25 -Advised pt that Iron needs increase during the third trimester of pregnancy  -Advised pt that her anemia is out of proportion with what we would expect with her current Ferritin levels and normocytic RBC  -Advised pt that there is a chance that other nutritional deficiencies could play a role in her anemia  -Advised pt that it may take time to increase Ferritin levels with PO Iron -Advised pt that Iron Polysaccharide may be easier on the stomach than Ferrous Sulfate  -Advised pt that there may be an allergic reaction with IV Iron. Pt would need to be monitored at [facility] during the infusion. -Will give IV Iron, at least two infusions, based on labs today - pt agrees -Will get labs today  -Continue prenatal vitamins daily  -Rx PO Iron Polysaccharide 150 mg po twice daily   FOLLOW UP: Labs today IV Iron based on labs  All of the patients questions were answered with apparent satisfaction. The patient knows to call the clinic with any problems, questions or concerns.  I spent 35  mins  counseling the patient face to face. The total time spent in the appointment was 45 mins minutes and more  than 50% was on counseling and direct patient cares.    Sullivan Lone MD [place] AAHIVMS [facility] Hematology/Oncology Physician [facility]  (Office):       [phone] (Work cell):  [phone] (Fax):           [phone]  05/04/2020 5:02 PM  I, Yevette Edwards, am acting as a scribe for Dr. Sullivan Lone.   .I have reviewed the above documentation for accuracy and completeness, and I agree with the above. Brunetta Genera MD    ADDENDUM  Component     Latest Ref Rng & Units 05/04/2020  Iron     41 - 142 ug/dL 55  TIBC     236 - 444 ug/dL 523 (H)  Saturation Ratios     21 - 57 % 10 (L)  UIBC     120 - 384 ug/dL 468 (H)  Folate, Hemolysate     Not Estab. ng/mL 332.0  HCT     34.0 - 46.6 % 27.3 (L)  Folate, RBC     >498 ng/mL 1,216  Vitamin B12     180 - 914 pg/mL 126 (L)  Ferritin     11 - 307 ng/mL 14   Severe B12 deficiency --recommended patient start B12 sublingual 1000 mcg daily -continue Iron polysaccharide 150mg  po BID -will offer patient option for IV Venofer 200mg  weekly x 3 doses at [facility] -continue prenatal vitamins

## 2020-05-05 LAB — FOLATE RBC
Folate, Hemolysate: 332 ng/mL
Folate, RBC: 1216 ng/mL (ref >498)
Hematocrit: 27.3 % — ABNORMAL LOW (ref 34.0–46.6)

## 2020-05-11 ENCOUNTER — Other Ambulatory Visit: Payer: Self-pay | Admitting: Hematology

## 2020-05-11 ENCOUNTER — Other Ambulatory Visit: Payer: Self-pay | Admitting: *Deleted

## 2020-05-11 DIAGNOSIS — O99013 Anemia complicating pregnancy, third trimester: Secondary | ICD-10-CM

## 2020-05-11 MED ORDER — B-12 1000 MCG SL SUBL: 1000.0000 ug | SUBLINGUAL_TABLET | Freq: Every day | SUBLINGUAL | 5 refills | Status: DC

## 2020-05-11 NOTE — Addendum Note (Signed)
Addended by: Wyvonnia Lora on: 05/11/2020 12:10 AM   Modules accepted: Orders

## 2020-05-13 ENCOUNTER — Telehealth: Payer: Self-pay | Admitting: *Deleted

## 2020-05-13 NOTE — Telephone Encounter (Signed)
Contacted patient per Dr. Clyda Greener directions below: --recommended patient start B12 sublingual 1000 mcg daily - prescription sent to pharmacy --continue Iron polysaccharide 150mg  po BID --continue prenatal vitamins --will offer patient option for IV Venofer 200mg  weekly x 3 doses at(will need to setup scheduling) Patient in agreement with all recommendations. Contacted  [facility] - scheduled for first dose at 6/25 at 8am. Day Center will schedule next 2 doses while patient is there. Patient informed of time of appt and given number for Day Center to contact if needed.

## 2020-05-19 NOTE — Discharge Instructions (Signed)

## 2020-05-20 ENCOUNTER — Other Ambulatory Visit: Payer: Self-pay

## 2020-05-20 ENCOUNTER — Ambulatory Visit (HOSPITAL_COMMUNITY)
Admission: RE | Admit: 2020-05-20 | Discharge: 2020-05-20 | Disposition: A | Discharge status: Home / Self Care | Payer: Commercial Managed Care - PPO | Source: Ambulatory Visit | Attending: Hematology | Admitting: Hematology

## 2020-05-20 DIAGNOSIS — D509 Iron deficiency anemia, unspecified: Secondary | ICD-10-CM | POA: Insufficient documentation

## 2020-05-20 DIAGNOSIS — O99013 Anemia complicating pregnancy, third trimester: Secondary | ICD-10-CM | POA: Diagnosis present

## 2020-05-20 MED ORDER — ACETAMINOPHEN 325 MG PO TABS
ORAL_TABLET | ORAL | Status: AC
  Filled 2020-05-20: qty 2

## 2020-05-20 MED ORDER — LORATADINE 10 MG PO TABS
ORAL_TABLET | ORAL | Status: AC
  Filled 2020-05-20: qty 1

## 2020-05-20 MED ORDER — SODIUM CHLORIDE 0.9 % IV SOLN
200.0000 mg | INTRAVENOUS | Status: DC
  Administered 2020-05-20: 200 mg via INTRAVENOUS
  Filled 2020-05-20: qty 10

## 2020-05-20 MED ORDER — ACETAMINOPHEN 325 MG PO TABS
650.0000 mg | ORAL_TABLET | Freq: Once | ORAL | Status: DC
  Administered 2020-05-20: 650 mg via ORAL

## 2020-05-20 MED ORDER — LORATADINE 10 MG PO TABS
10.0000 mg | ORAL_TABLET | Freq: Every day | ORAL | Status: DC
  Administered 2020-05-20: 10 mg via ORAL

## 2020-05-27 ENCOUNTER — Encounter (HOSPITAL_COMMUNITY)
Admission: RE | Admit: 2020-05-27 | Discharge: 2020-05-27 | Disposition: A | Discharge status: Home / Self Care | Payer: Commercial Managed Care - PPO | Source: Ambulatory Visit | Attending: Hematology | Admitting: Hematology

## 2020-05-27 ENCOUNTER — Other Ambulatory Visit: Payer: Self-pay

## 2020-05-27 DIAGNOSIS — D509 Iron deficiency anemia, unspecified: Secondary | ICD-10-CM | POA: Insufficient documentation

## 2020-05-27 DIAGNOSIS — O99013 Anemia complicating pregnancy, third trimester: Secondary | ICD-10-CM | POA: Diagnosis present

## 2020-05-27 MED ORDER — SODIUM CHLORIDE 0.9 % IV SOLN
200.0000 mg | INTRAVENOUS | Status: DC
  Administered 2020-05-27: 200 mg via INTRAVENOUS
  Filled 2020-05-27: qty 10

## 2020-05-27 MED ORDER — ACETAMINOPHEN 325 MG PO TABS
ORAL_TABLET | ORAL | Status: AC
  Administered 2020-05-27: 650 mg via ORAL
  Filled 2020-05-27: qty 2

## 2020-05-27 MED ORDER — ACETAMINOPHEN 325 MG PO TABS: 650.0000 mg | ORAL_TABLET | Freq: Once | ORAL | Status: AC

## 2020-05-27 MED ORDER — LORATADINE 10 MG PO TABS
ORAL_TABLET | ORAL | Status: AC
  Administered 2020-05-27: 10 mg via ORAL
  Filled 2020-05-27: qty 1

## 2020-05-27 MED ORDER — LORATADINE 10 MG PO TABS: 10.0000 mg | ORAL_TABLET | Freq: Every day | ORAL | Status: DC

## 2020-06-02 ENCOUNTER — Inpatient Hospital Stay (HOSPITAL_COMMUNITY)
Admission: AD | Admit: 2020-06-02 | Discharge: 2020-06-02 | Disposition: A | Discharge status: Home / Self Care | Payer: Commercial Managed Care - PPO | Attending: Obstetrics and Gynecology | Admitting: Obstetrics and Gynecology

## 2020-06-02 ENCOUNTER — Encounter (HOSPITAL_COMMUNITY): Payer: Self-pay | Admitting: Obstetrics and Gynecology

## 2020-06-02 ENCOUNTER — Ambulatory Visit (HOSPITAL_BASED_OUTPATIENT_CLINIC_OR_DEPARTMENT_OTHER)
Admit: 2020-06-02 | Discharge: 2020-06-02 | Disposition: A | Discharge status: Home / Self Care | Payer: Commercial Managed Care - PPO | Attending: Advanced Practice Midwife | Admitting: Advanced Practice Midwife

## 2020-06-02 DIAGNOSIS — M7989 Other specified soft tissue disorders: Secondary | ICD-10-CM

## 2020-06-02 DIAGNOSIS — Z3A34 34 weeks gestation of pregnancy: Secondary | ICD-10-CM | POA: Diagnosis not present

## 2020-06-02 DIAGNOSIS — Z79899 Other long term (current) drug therapy: Secondary | ICD-10-CM | POA: Diagnosis not present

## 2020-06-02 DIAGNOSIS — F419 Anxiety disorder, unspecified: Secondary | ICD-10-CM | POA: Insufficient documentation

## 2020-06-02 DIAGNOSIS — F429 Obsessive-compulsive disorder, unspecified: Secondary | ICD-10-CM | POA: Insufficient documentation

## 2020-06-02 DIAGNOSIS — O1203 Gestational edema, third trimester: Secondary | ICD-10-CM | POA: Diagnosis not present

## 2020-06-02 DIAGNOSIS — R609 Edema, unspecified: Secondary | ICD-10-CM | POA: Diagnosis present

## 2020-06-02 DIAGNOSIS — O99343 Other mental disorders complicating pregnancy, third trimester: Secondary | ICD-10-CM | POA: Insufficient documentation

## 2020-06-02 NOTE — Discharge Instructions (Signed)
Edema  Edema is when you have too much fluid in your body or under your skin. Edema may make your legs, feet, and ankles swell up. Swelling is also common in looser tissues, like around your eyes. This is a common condition. It gets more common as you get older. There are many possible causes of edema. Eating too much salt (sodium) and being on your feet or sitting for a long time can cause edema in your legs, feet, and ankles. Hot weather may make edema worse. Edema is usually painless. Your skin may look swollen or shiny. Follow these instructions at home:  Keep the swollen body part raised (elevated) above the level of your heart when you are sitting or lying down.  Do not sit still or stand for a long time.  Do not wear tight clothes. Do not wear garters on your upper legs.  Exercise your legs. This can help the swelling go down.  Wear elastic bandages or support stockings as told by your doctor.  Eat a low-salt (low-sodium) diet to reduce fluid as told by your doctor.  Depending on the cause of your swelling, you may need to limit how much fluid you drink (fluid restriction).  Take over-the-counter and prescription medicines only as told by your doctor. Contact a doctor if:  Treatment is not working.  You have heart, liver, or kidney disease and have symptoms of edema.  You have sudden and unexplained weight gain. Get help right away if:  You have shortness of breath or chest pain.  You cannot breathe when you lie down.  You have pain, redness, or warmth in the swollen areas.  You have heart, liver, or kidney disease and get edema all of a sudden.  You have a fever and your symptoms get worse all of a sudden. Summary  Edema is when you have too much fluid in your body or under your skin.  Edema may make your legs, feet, and ankles swell up. Swelling is also common in looser tissues, like around your eyes.  Raise (elevate) the swollen body part above the level of your  heart when you are sitting or lying down.  Follow your doctor's instructions about diet and how much fluid you can drink (fluid restriction). This information is not intended to replace advice given to you by your health care provider. Make sure you discuss any questions you have with your health care provider. Document Revised: 11/15/2017 Document Reviewed: 11/30/2016 Elsevier Patient Education  2020 Elsevier Inc.  

## 2020-06-02 NOTE — Progress Notes (Signed)
Left lower extremity venous duplex has been completed. Preliminary results can be found in CV Proc through chart review.  Results were given to [facility] CNM.  06/02/20 6:14 PM Olen Cordial RVT

## 2020-06-02 NOTE — MAU Provider Note (Signed)
History     CSN: [id number]  Arrival date and time: 06/02/20 1713   First Provider Initiated Contact with Patient 06/02/20 1828      Chief Complaint  Patient presents with  . Foot Swelling   HPI Emily Lowe is a 24 y.o. G1P0 at [redacted]w[redacted]d who presents to MAU with chief complaint of unilateral swelling. Patient endorses recurrent bilateral swelling for the past couple weeks. She endorses increased swelling in her left leg beginning within the past couple days. She denies calf pain, discoloration, SOB, activity intolerance, weakness, syncope.  She denies vaginal bleeding, leaking of fluid, decreased fetal movement, fever, falls, or recent illness. Patient's mom is a geriatric nurse and encouraged patient to present for evaluation.  She receives care with [facility] and was just seen Tuesday 07/06. Her next appointment is in two weeks.  OB History    Gravida  1   Para      Term      Preterm      AB      Living        SAB      TAB      Ectopic      Multiple      Live Births              Past Medical History:  Diagnosis Date  . Anxiety   . Dyspnea   . History of kidney stones   . Kidney stones    followed by Dr. Achilles Dunk  . OCD (obsessive compulsive disorder)     Past Surgical History:  Procedure Laterality Date  . CYSTOSCOPY W/ URETERAL STENT PLACEMENT Right 03/18/2020   Procedure: CYSTOSCOPY WITH STENT REPLACEMENT;  Surgeon: Rene Paci, MD;  Location: [facility];  Service: Urology;  Laterality: Right;  . CYSTOSCOPY/URETEROSCOPY/HOLMIUM LASER/STENT PLACEMENT Left 01/26/2020   Procedure: CYSTOSCOPY/URETEROSCOPY/HOLMIUM LASER/STENT PLACEMENT/REMOVAL PERCUTANEOUS NEPHROSTOMY TUBE LEFT;  Surgeon: Noel Christmas, MD;  Location: [facility];  Service: Urology;  Laterality: Left;  90 MINS  . CYSTOSCOPY/URETEROSCOPY/HOLMIUM LASER/STENT PLACEMENT Bilateral 04/05/2020   Procedure: CYSTOSCOPY/URETEROSCOPY/HOLMIUM LASER/STENT PLACEMENT;  Surgeon: Noel Christmas,  MD;  Location: [facility];  Service: Urology;  Laterality: Bilateral;  90 MINS  . IR NEPHROSTOMY PLACEMENT LEFT  12/15/2019  . WISDOM TOOTH EXTRACTION      Family History  Problem Relation Age of Onset  . Hypertension Mother   . Gout Father     Social History   Tobacco Use  . Smoking status: Never Smoker  . Smokeless tobacco: Never Used  Vaping Use  . Vaping Use: Never used  Substance Use Topics  . Alcohol use: Not Currently    Comment: Occas.  . Drug use: No    Allergies: No Known Allergies  Medications Prior to Admission  Medication Sig Dispense Refill Last Dose  . acetaminophen (TYLENOL) 500 MG tablet Take 500 mg by mouth every 6 (six) hours as needed for moderate pain.      . Cyanocobalamin (B-12) 1000 MCG SUBL Place 1,000 mcg under the tongue daily. 30 tablet 5   . famotidine (PEPCID) 20 MG tablet Take 20 mg by mouth daily as needed for heartburn or indigestion.     Marland Kitchen HYDROmorphone (DILAUDID) 2 MG tablet Take 1 tablet (2 mg total) by mouth every 6 (six) hours as needed for moderate pain. 15 tablet 0   . iron polysaccharides (NIFEREX) 150 MG capsule Take 1 capsule (150 mg total) by mouth 2 (two) times daily. 60 capsule 5   .  metoCLOPramide (REGLAN) 10 MG tablet Take 10 mg by mouth every 6 (six) hours as needed for nausea or vomiting.      Marland Kitchen oxybutynin (DITROPAN) 5 MG tablet Take 1 tablet (5 mg total) by mouth every 8 (eight) hours as needed for bladder spasms. 30 tablet 1   . Prenatal Vit-Fe Fumarate-FA (PRENATAL MULTIVITAMIN) TABS tablet Take 1 tablet by mouth daily at 12 noon.     . promethazine (PHENERGAN) 12.5 MG tablet Take 1 tablet (12.5 mg total) by mouth every 6 (six) hours as needed for nausea or vomiting. 30 tablet 0     Review of Systems  Constitutional: Negative for chills, fatigue and fever.  Respiratory: Negative for shortness of breath.   Gastrointestinal: Negative for abdominal pain.  Genitourinary: Negative for dysuria and vaginal bleeding.   Musculoskeletal: Positive for joint swelling.  Neurological: Negative for syncope and weakness.  All other systems reviewed and are negative.  Physical Exam   Blood pressure 112/77, pulse (!) 101, temperature 98.4 F (36.9 C), temperature source Oral, resp. rate 16, height 5' (1.524 m), weight 76.4 kg, last menstrual period 07/04/2019, SpO2 100 %.  Physical Exam Vitals and nursing note reviewed. Exam conducted with a chaperone present.  Cardiovascular:     Rate and Rhythm: Normal rate.     Pulses: Normal pulses.     Heart sounds: Normal heart sounds.  Pulmonary:     Effort: Pulmonary effort is normal.     Breath sounds: Normal breath sounds.  Abdominal:     Comments: Gravid  Musculoskeletal:     Right lower leg: Edema present.     Left lower leg: Edema present.     Comments: Bilateral lower extremity +1 edema. Limbs with identical measurements below knee and at base of calf  Skin:    General: Skin is warm and dry.     Capillary Refill: Capillary refill takes less than 2 seconds.  Neurological:     Mental Status: She is alert.  Psychiatric:        Mood and Affect: Mood normal.        Behavior: Behavior normal.        Thought Content: Thought content normal.     MAU Course/MDM  Procedures  Patient Vitals for the past 24 hrs:  BP Temp Temp src Pulse Resp SpO2 Height Weight  06/02/20 1853 113/69 98.1 F (36.7 C) Oral (!) 101 16 100 % -- --  06/02/20 1731 112/77 98.4 F (36.9 C) Oral (!) 101 16 100 % 5' (1.524 m) 76.4 kg   --Reactive tracing: baseline 140, moderate variability, positive 15 x 15 accels, no decels --Toco: UI  Assessment and Plan  --24 y.o. G1P0 at [redacted]w[redacted]d  --Reactive tracing --Venous Doppler negative for DVT --Advised PO hydration with water, compression socks, elevating legs at rest --Discharge home in stable condition  F/U: --As needed with [facility] --Return to MAU for pregnancy-related emergencies  Calvert Cantor, CNM 06/02/2020, 7:28  PM

## 2020-06-02 NOTE — MAU Note (Signed)
Today left foot and ankle is swollen.  Both have been swollen off and on in both feet. Doesn't understand why this is happening, and why it would just be on one side.

## 2020-06-03 ENCOUNTER — Encounter (HOSPITAL_COMMUNITY)
Admission: RE | Admit: 2020-06-03 | Discharge: 2020-06-03 | Disposition: A | Discharge status: Home / Self Care | Payer: Commercial Managed Care - PPO | Source: Ambulatory Visit | Attending: Hematology | Admitting: Hematology

## 2020-06-03 ENCOUNTER — Other Ambulatory Visit: Payer: Self-pay

## 2020-06-03 DIAGNOSIS — O99013 Anemia complicating pregnancy, third trimester: Secondary | ICD-10-CM

## 2020-06-03 DIAGNOSIS — D509 Iron deficiency anemia, unspecified: Secondary | ICD-10-CM

## 2020-06-03 MED ORDER — LORATADINE 10 MG PO TABS
ORAL_TABLET | ORAL | Status: AC
  Filled 2020-06-03: qty 1

## 2020-06-03 MED ORDER — LORATADINE 10 MG PO TABS
10.0000 mg | ORAL_TABLET | Freq: Every day | ORAL | Status: DC
  Administered 2020-06-03: 10 mg via ORAL

## 2020-06-03 MED ORDER — ACETAMINOPHEN 325 MG PO TABS
ORAL_TABLET | ORAL | Status: AC
  Filled 2020-06-03: qty 2

## 2020-06-03 MED ORDER — SODIUM CHLORIDE 0.9 % IV SOLN
200.0000 mg | INTRAVENOUS | Status: DC
  Administered 2020-06-03: 200 mg via INTRAVENOUS
  Filled 2020-06-03: qty 10

## 2020-06-03 MED ORDER — ACETAMINOPHEN 325 MG PO TABS
650.0000 mg | ORAL_TABLET | Freq: Once | ORAL | Status: AC
  Administered 2020-06-03: 650 mg via ORAL

## 2020-06-15 LAB — OB RESULTS CONSOLE GBS: GBS: POSITIVE

## 2020-07-16 ENCOUNTER — Encounter (HOSPITAL_COMMUNITY): Payer: Self-pay | Admitting: Obstetrics and Gynecology

## 2020-07-16 ENCOUNTER — Inpatient Hospital Stay (HOSPITAL_COMMUNITY): Payer: Commercial Managed Care - PPO | Admitting: Anesthesiology

## 2020-07-16 ENCOUNTER — Other Ambulatory Visit: Payer: Self-pay

## 2020-07-16 ENCOUNTER — Inpatient Hospital Stay (HOSPITAL_COMMUNITY)
Admission: AD | Admit: 2020-07-16 | Discharge: 2020-07-20 | DRG: 786 | Disposition: A | Discharge status: Home / Self Care | Payer: Commercial Managed Care - PPO | Attending: Obstetrics and Gynecology | Admitting: Obstetrics and Gynecology

## 2020-07-16 DIAGNOSIS — Z87442 Personal history of urinary calculi: Secondary | ICD-10-CM

## 2020-07-16 DIAGNOSIS — Z98891 History of uterine scar from previous surgery: Secondary | ICD-10-CM

## 2020-07-16 DIAGNOSIS — D509 Iron deficiency anemia, unspecified: Secondary | ICD-10-CM | POA: Diagnosis present

## 2020-07-16 DIAGNOSIS — Z3A40 40 weeks gestation of pregnancy: Secondary | ICD-10-CM

## 2020-07-16 DIAGNOSIS — O26893 Other specified pregnancy related conditions, third trimester: Secondary | ICD-10-CM | POA: Diagnosis present

## 2020-07-16 DIAGNOSIS — Z20822 Contact with and (suspected) exposure to covid-19: Secondary | ICD-10-CM | POA: Diagnosis present

## 2020-07-16 DIAGNOSIS — O9902 Anemia complicating childbirth: Secondary | ICD-10-CM | POA: Diagnosis present

## 2020-07-16 DIAGNOSIS — O99824 Streptococcus B carrier state complicating childbirth: Secondary | ICD-10-CM | POA: Diagnosis present

## 2020-07-16 DIAGNOSIS — O411230 Chorioamnionitis, third trimester, not applicable or unspecified: Secondary | ICD-10-CM | POA: Diagnosis present

## 2020-07-16 HISTORY — DX: Unspecified convulsions: R56.9

## 2020-07-16 HISTORY — DX: Depression, unspecified: F32.A

## 2020-07-16 HISTORY — DX: Unspecified infectious disease: B99.9

## 2020-07-16 LAB — CBC
HCT: 30.7 % — ABNORMAL LOW (ref 36.0–46.0)
Hemoglobin: 9.9 g/dL — ABNORMAL LOW (ref 12.0–15.0)
MCH: 29.5 pg (ref 26.0–34.0)
MCHC: 32.2 g/dL (ref 30.0–36.0)
MCV: 91.4 fL (ref 80.0–100.0)
Platelets: 143 10*3/uL — ABNORMAL LOW (ref 150–400)
RBC: 3.36 MIL/uL — ABNORMAL LOW (ref 3.87–5.11)
RDW: 15.1 % (ref 11.5–15.5)
WBC: 15.4 10*3/uL — ABNORMAL HIGH (ref 4.0–10.5)
nRBC: 0 % (ref 0.0–0.2)

## 2020-07-16 LAB — TYPE AND SCREEN
ABO/RH(D): B POS
Antibody Screen: NEGATIVE

## 2020-07-16 LAB — SARS CORONAVIRUS 2 BY RT PCR (HOSPITAL ORDER, PERFORMED IN ~~LOC~~ HOSPITAL LAB): SARS Coronavirus 2: NEGATIVE

## 2020-07-16 MED ORDER — SODIUM CHLORIDE 0.9 % IV SOLN
5.0000 10*6.[IU] | Freq: Once | INTRAVENOUS | Status: AC
  Administered 2020-07-16: 5 10*6.[IU] via INTRAVENOUS
  Filled 2020-07-16: qty 5

## 2020-07-16 MED ORDER — ONDANSETRON HCL 4 MG/2ML IJ SOLN
4.0000 mg | Freq: Four times a day (QID) | INTRAMUSCULAR | Status: DC | PRN
  Administered 2020-07-16: 4 mg via INTRAVENOUS
  Filled 2020-07-16: qty 2

## 2020-07-16 MED ORDER — DIPHENHYDRAMINE HCL 50 MG/ML IJ SOLN: 12.5000 mg | INTRAMUSCULAR | Status: DC | PRN

## 2020-07-16 MED ORDER — LIDOCAINE HCL (PF) 1 % IJ SOLN: 30.0000 mL | INTRAMUSCULAR | Status: DC | PRN

## 2020-07-16 MED ORDER — PHENYLEPHRINE 40 MCG/ML (10ML) SYRINGE FOR IV PUSH (FOR BLOOD PRESSURE SUPPORT)
80.0000 ug | PREFILLED_SYRINGE | INTRAVENOUS | Status: DC | PRN
  Filled 2020-07-16: qty 10

## 2020-07-16 MED ORDER — OXYCODONE-ACETAMINOPHEN 5-325 MG PO TABS: 1.0000 | ORAL_TABLET | ORAL | Status: DC | PRN

## 2020-07-16 MED ORDER — OXYCODONE-ACETAMINOPHEN 5-325 MG PO TABS: 2.0000 | ORAL_TABLET | ORAL | Status: DC | PRN

## 2020-07-16 MED ORDER — OXYTOCIN BOLUS FROM INFUSION: 333.0000 mL | Freq: Once | INTRAVENOUS | Status: DC

## 2020-07-16 MED ORDER — LACTATED RINGERS IV SOLN: 500.0000 mL | Freq: Once | INTRAVENOUS | Status: DC

## 2020-07-16 MED ORDER — EPHEDRINE 5 MG/ML INJ: 10.0000 mg | INTRAVENOUS | Status: DC | PRN

## 2020-07-16 MED ORDER — BUTORPHANOL TARTRATE 1 MG/ML IJ SOLN
1.0000 mg | INTRAMUSCULAR | Status: DC | PRN
  Administered 2020-07-16: 1 mg via INTRAVENOUS
  Filled 2020-07-16: qty 1

## 2020-07-16 MED ORDER — PENICILLIN G POT IN DEXTROSE 60000 UNIT/ML IV SOLN: 3.0000 10*6.[IU] | INTRAVENOUS | Status: DC

## 2020-07-16 MED ORDER — FENTANYL-BUPIVACAINE-NACL 0.5-0.125-0.9 MG/250ML-% EP SOLN
12.0000 mL/h | EPIDURAL | Status: DC | PRN
  Filled 2020-07-16: qty 250

## 2020-07-16 MED ORDER — LIDOCAINE HCL (PF) 1 % IJ SOLN
INTRAMUSCULAR | Status: DC | PRN
  Administered 2020-07-16: 6 mL via EPIDURAL
  Administered 2020-07-16: 4 mL via EPIDURAL

## 2020-07-16 MED ORDER — LACTATED RINGERS IV SOLN: INTRAVENOUS | Status: DC

## 2020-07-16 MED ORDER — ACETAMINOPHEN 500 MG PO TABS
1000.0000 mg | ORAL_TABLET | Freq: Once | ORAL | Status: AC
  Administered 2020-07-16: 1000 mg via ORAL
  Filled 2020-07-16: qty 2

## 2020-07-16 MED ORDER — ACETAMINOPHEN 80 MG RE SUPP: 1000.0000 mg | Freq: Once | RECTAL | Status: DC

## 2020-07-16 MED ORDER — SODIUM CHLORIDE 0.9 % IV SOLN
1.0000 g | Freq: Four times a day (QID) | INTRAVENOUS | Status: DC
  Administered 2020-07-16: 1 g via INTRAVENOUS
  Filled 2020-07-16 (×3): qty 1000

## 2020-07-16 MED ORDER — LACTATED RINGERS IV SOLN: 500.0000 mL | INTRAVENOUS | Status: DC | PRN

## 2020-07-16 MED ORDER — ACETAMINOPHEN 325 MG PO TABS: 650.0000 mg | ORAL_TABLET | ORAL | Status: DC | PRN

## 2020-07-16 MED ORDER — FLEET ENEMA 7-19 GM/118ML RE ENEM: 1.0000 | ENEMA | RECTAL | Status: DC | PRN

## 2020-07-16 MED ORDER — SOD CITRATE-CITRIC ACID 500-334 MG/5ML PO SOLN
30.0000 mL | ORAL | Status: DC | PRN
  Administered 2020-07-17: 30 mL via ORAL
  Filled 2020-07-16: qty 30

## 2020-07-16 MED ORDER — SODIUM CHLORIDE (PF) 0.9 % IJ SOLN
INTRAMUSCULAR | Status: DC | PRN
  Administered 2020-07-16: 12 mL/h via EPIDURAL

## 2020-07-16 MED ORDER — GENTAMICIN SULFATE 40 MG/ML IJ SOLN
5.0000 mg/kg | INTRAVENOUS | Status: AC
  Administered 2020-07-16: 300 mg via INTRAVENOUS
  Filled 2020-07-16: qty 7.5

## 2020-07-16 MED ORDER — PHENYLEPHRINE 40 MCG/ML (10ML) SYRINGE FOR IV PUSH (FOR BLOOD PRESSURE SUPPORT): 80.0000 ug | PREFILLED_SYRINGE | INTRAVENOUS | Status: DC | PRN

## 2020-07-16 MED ORDER — OXYTOCIN-SODIUM CHLORIDE 30-0.9 UT/500ML-% IV SOLN: 2.5000 [IU]/h | INTRAVENOUS | Status: DC

## 2020-07-16 NOTE — Progress Notes (Addendum)
Pharmacy Antibiotic Note  Emily Lowe is a 24 y.o. female admitted on 07/16/2020  At 40+ weeks pregnant w/ contractions. Now presents with fever temp = 101.3 with presumed Triple I.  Pharmacy has been consulted for Gentamicin dosing.  Plan: Gentamicin 5mg /kg IV x 1  Height: 5' (152.4 cm) Weight: 83.9 kg (185 lb) IBW/kg (Calculated) : 45.5  Temp (24hrs), Avg:100 F (37.8 C), Min:99.3 F (37.4 C), Max:101.3 F (38.5 C)  Recent Labs  Lab 07/16/20 1930  WBC 15.4*    CrCl cannot be calculated (Patient's most recent lab result is older than the maximum 21 days allowed.).    No Known Allergies  Antimicrobials this admission: Penicillin  (8/21>>) Ampicillin 2 g Iv Q6 (8/22 >>)  Thank you for allowing pharmacy to be a part of this patient's care.  Hovey-Rankin, Journei Thomassen 07/16/2020 9:45 PM

## 2020-07-16 NOTE — Progress Notes (Signed)
Pt informed that the ultrasound is considered a limited OB ultrasound and is not intended to be a complete ultrasound exam.  Patient also informed that the ultrasound is not being completed with the intent of assessing for fetal or placental anomalies or any pelvic abnormalities.  Explained that the purpose of today's ultrasound is to assess for  presentation.  Vertex presentation confirmed. Patient acknowledges the purpose of the exam and the limitations of the study.    Rolm Bookbinder CNM 07/16/20 6:43 PM

## 2020-07-16 NOTE — H&P (Signed)
Emily Lowe is a 24 y.o. female G1 at 9+ with contractions/back labor.  Pregnancy complicated by kidney stone - neph tube, cysto and stent in 1st and 2nd trimester, anemia of pregnancy - received iron infusion; and itching - with nl bile acids - given Atarax for symptomatic control.  Also GBBS +.  Received Tdap in [facility].  Pregnancy dated by first trimester [place].  Panorama - low risk female.    OB History    Gravida  1   Para      Term      Preterm      AB      Living        SAB      TAB      Ectopic      Multiple      Live Births            G1 present  No abn pap No STD  Past Medical History:  Diagnosis Date  . Anxiety   . Depression    couple months ago, never talked to anyone  . Dyspnea   . History of kidney stones   . Infection    UTI  . Kidney stones    followed by Dr. Achilles Dunk  . OCD (obsessive compulsive disorder)   . Seizures [facility])    age 79- ? cause, only once   Past Surgical History:  Procedure Laterality Date  . CYSTOSCOPY W/ URETERAL STENT PLACEMENT Right 03/18/2020   Procedure: CYSTOSCOPY WITH STENT REPLACEMENT;  Surgeon: Rene Paci, MD;  [facility];  Service: Urology;  Laterality: Right;  . CYSTOSCOPY/URETEROSCOPY/HOLMIUM LASER/STENT PLACEMENT Left 01/26/2020   Procedure: CYSTOSCOPY/URETEROSCOPY/HOLMIUM LASER/STENT PLACEMENT/REMOVAL PERCUTANEOUS NEPHROSTOMY TUBE LEFT;  Surgeon: Noel Christmas, MD;  [facility];  Service: Urology;  Laterality: Left;  90 MINS  . CYSTOSCOPY/URETEROSCOPY/HOLMIUM LASER/STENT PLACEMENT Bilateral 04/05/2020   Procedure: CYSTOSCOPY/URETEROSCOPY/HOLMIUM LASER/STENT PLACEMENT;  Surgeon: Noel Christmas, MD;  Location: [facility];  Service: Urology;  Laterality: Bilateral;  90 MINS  . IR NEPHROSTOMY PLACEMENT LEFT  12/15/2019  . WISDOM TOOTH EXTRACTION     Family History: family history includes Diabetes in her father; Gout in her father; Hypertension in her mother. Social History:  reports that she  has never smoked. She has never used smokeless tobacco. She reports previous alcohol use. She reports that she does not use drugs. single  Meds PNV, Iron All NKDA     Maternal Diabetes: No Genetic Screening: Normal Maternal Ultrasounds/Referrals: Normal Fetal Ultrasounds or other Referrals:  None Maternal Substance Abuse:  No Significant Maternal Medications:  None Significant Maternal Lab Results:  Group B Strep positive Other Comments:  None  Review of Systems  Constitutional: Negative.   HENT: Negative.   Respiratory: Negative.   Cardiovascular: Negative.   Gastrointestinal: Negative.   Genitourinary: Negative.   Musculoskeletal: Positive for back pain.  Skin: Negative.   Neurological: Negative.   Psychiatric/Behavioral: Negative.    Maternal Medical History:  Reason for admission: Contractions.   Contractions: Perceived severity is moderate.    Fetal activity: Perceived fetal activity is normal.    Prenatal complications: Kidney stone - neph tube, cysto and stent - 1st and 2nd trimester Anemia of pregnancy - s/p iron infusion; Itching w nl bile acids - given Atarax  Prenatal Complications - Diabetes: none.    Dilation: 1 Effacement (%): 80 Station: Ballotable Exam by:: jolynn Temperature 99.5 F (37.5 C), temperature source Oral, resp. rate (!) 22, last menstrual period 07/04/2019. Maternal  Exam:  Abdomen: Fundal height is appropriate for gestation.   Estimated fetal weight is 6-7#.   Fetal presentation: vertex  Introitus: Normal vulva. Normal vagina.    Physical Exam Constitutional:      Appearance: Normal appearance.  Cardiovascular:     Rate and Rhythm: Normal rate and regular rhythm.  Pulmonary:     Effort: Pulmonary effort is normal.     Breath sounds: Normal breath sounds.  Abdominal:     General: Bowel sounds are normal.     Palpations: Abdomen is soft.     Comments: GRAVID  Genitourinary:    General: Normal vulva.  Musculoskeletal:         General: Normal range of motion.     Cervical back: Normal range of motion and neck supple.  Skin:    General: Skin is warm and dry.  Neurological:     Mental Status: She is alert and oriented to person, place, and time.  Psychiatric:        Mood and Affect: Mood normal.        Behavior: Behavior normal.     Prenatal labs: ABO, Rh: --/--/B POS (01/18 1550) Antibody: NEG (01/18 1550) Rubella:  nonimmune RPR:   NR HBsAg:   NR HIV:   NR GBS:   POSITIVE Hgb 11.5/Plt 299/Ur Cx neg/Chl neg/GC neg/Hep C neg/ Varicella nonimmune/ bile acids WNL/Carrier screen neg - CF, SMA and fragile X/glucola 64 Panorama low risk, female  Tdap 7/6  [place] - nl anat, post plac, female    Assessment/Plan: 24yo G1P0 at 64+ with contractions Admit to L&D PCN for GBBS + Epidural or IV pain meds prn Augment w pitocin/AROM prn Expect SVD  Osualdo Hansell Bovard-Stuckert 07/16/2020, 7:16 PM

## 2020-07-16 NOTE — MAU Note (Signed)
Had contractions yesterday, but they stopped.  Started again around 2 or 3. No bleeding or leaking.  Was 1 cm when last checked.

## 2020-07-16 NOTE — Anesthesia Preprocedure Evaluation (Signed)
Anesthesia Evaluation  Patient identified by MRN, date of birth, ID band Patient awake    Reviewed: Allergy & Precautions, H&P , NPO status , Patient's Chart, lab work & pertinent test results  History of Anesthesia Complications Negative for: history of anesthetic complications  Airway Mallampati: II  TM Distance: >3 FB Neck ROM: full    Dental no notable dental hx.    Pulmonary neg pulmonary ROS,    Pulmonary exam normal        Cardiovascular negative cardio ROS Normal cardiovascular exam Rhythm:regular Rate:Normal     Neuro/Psych PSYCHIATRIC DISORDERS Anxiety Depression negative neurological ROS     GI/Hepatic Neg liver ROS, GERD  ,  Endo/Other  negative endocrine ROS  Renal/GU Renal disease (kidney stones)  negative genitourinary   Musculoskeletal   Abdominal   Peds  Hematology  (+) Blood dyscrasia, anemia ,   Anesthesia Other Findings  Febrile, tachycardic, possible intrauterine infection  Reproductive/Obstetrics (+) Pregnancy                             Anesthesia Physical Anesthesia Plan  ASA: III  Anesthesia Plan: Epidural   Post-op Pain Management:    Induction:   PONV Risk Score and Plan:   Airway Management Planned:   Additional Equipment:   Intra-op Plan:   Post-operative Plan:   Informed Consent: I have reviewed the patients History and Physical, chart, labs and discussed the procedure including the risks, benefits and alternatives for the proposed anesthesia with the patient or authorized representative who has indicated his/her understanding and acceptance.       Plan Discussed with:   Anesthesia Plan Comments:         Anesthesia Quick Evaluation

## 2020-07-16 NOTE — Progress Notes (Signed)
Patient ID: Emily Lowe, female   DOB: 07-29-1996, 24 y.o.   MRN: [id number]  No changes to H&P Reviewed plan of care.   Fever d/w pt - will treat as Triple I - tylenol, fluid bolus, Amp and Gent  Tm = 101.3 VSS gen NAD FHTs 160's, min - mod var, some variables, category 2 toco q  1.2cm/50/-2  Continue close monitoring - d/w pt possible need for LTCS Wants to talk w anesthesia

## 2020-07-16 NOTE — Anesthesia Procedure Notes (Signed)
Epidural Patient location during procedure: OB Start time: 07/16/2020 9:57 PM End time: 07/16/2020 10:07 PM  Staffing Anesthesiologist: Lucretia Kern, MD Performed: anesthesiologist   Preanesthetic Checklist Completed: patient identified, IV checked, risks and benefits discussed, monitors and equipment checked, pre-op evaluation and timeout performed  Epidural Patient position: sitting Prep: DuraPrep Patient monitoring: heart rate, continuous pulse ox and blood pressure Approach: midline Location: L3-L4 Injection technique: LOR air  Needle:  Needle type: Tuohy  Needle gauge: 17 G Needle length: 9 cm Needle insertion depth: 5 cm Catheter type: closed end flexible Catheter size: 19 Gauge Catheter at skin depth: 10 cm Test dose: negative  Assessment Events: blood not aspirated, injection not painful, no injection resistance, no paresthesia and negative IV test  Additional Notes Reason for block:procedure for pain

## 2020-07-17 ENCOUNTER — Encounter (HOSPITAL_COMMUNITY)
Admission: AD | Disposition: A | Discharge status: Home / Self Care | Payer: Self-pay | Source: Home / Self Care | Attending: Obstetrics and Gynecology

## 2020-07-17 ENCOUNTER — Encounter (HOSPITAL_COMMUNITY): Payer: Self-pay | Admitting: Obstetrics and Gynecology

## 2020-07-17 DIAGNOSIS — Z98891 History of uterine scar from previous surgery: Secondary | ICD-10-CM

## 2020-07-17 HISTORY — DX: History of uterine scar from previous surgery: Z98.891

## 2020-07-17 LAB — RPR: RPR Ser Ql: NONREACTIVE

## 2020-07-17 SURGERY — Surgical Case
Anesthesia: Epidural

## 2020-07-17 MED ORDER — DIPHENHYDRAMINE HCL 25 MG PO CAPS: 25.0000 mg | ORAL_CAPSULE | ORAL | Status: DC | PRN

## 2020-07-17 MED ORDER — SODIUM CHLORIDE 0.9 % IR SOLN
Status: DC | PRN
  Administered 2020-07-17: 1000 mL

## 2020-07-17 MED ORDER — RISAQUAD PO CAPS
1.0000 | ORAL_CAPSULE | Freq: Every day | ORAL | Status: DC
  Administered 2020-07-17 – 2020-07-20 (×4): 1 via ORAL
  Filled 2020-07-17 (×4): qty 1

## 2020-07-17 MED ORDER — KETOROLAC TROMETHAMINE 30 MG/ML IJ SOLN: 30.0000 mg | Freq: Four times a day (QID) | INTRAMUSCULAR | Status: DC | PRN

## 2020-07-17 MED ORDER — NALOXONE HCL 0.4 MG/ML IJ SOLN: 0.4000 mg | INTRAMUSCULAR | Status: DC | PRN

## 2020-07-17 MED ORDER — SENNOSIDES-DOCUSATE SODIUM 8.6-50 MG PO TABS
2.0000 | ORAL_TABLET | ORAL | Status: DC
  Administered 2020-07-17 – 2020-07-19 (×2): 2 via ORAL
  Filled 2020-07-17 (×2): qty 2

## 2020-07-17 MED ORDER — MEPERIDINE HCL 25 MG/ML IJ SOLN
INTRAMUSCULAR | Status: DC | PRN
  Administered 2020-07-17: 12.5 mg via INTRAVENOUS

## 2020-07-17 MED ORDER — HYDROMORPHONE HCL 1 MG/ML IJ SOLN: 0.2500 mg | INTRAMUSCULAR | Status: DC | PRN

## 2020-07-17 MED ORDER — KETOROLAC TROMETHAMINE 30 MG/ML IJ SOLN
30.0000 mg | Freq: Four times a day (QID) | INTRAMUSCULAR | Status: DC | PRN
  Administered 2020-07-17: 30 mg via INTRAMUSCULAR

## 2020-07-17 MED ORDER — ACETAMINOPHEN 500 MG PO TABS
1000.0000 mg | ORAL_TABLET | Freq: Four times a day (QID) | ORAL | Status: DC
  Administered 2020-07-17 – 2020-07-20 (×12): 1000 mg via ORAL
  Filled 2020-07-17 (×12): qty 2

## 2020-07-17 MED ORDER — SIMETHICONE 80 MG PO CHEW
80.0000 mg | CHEWABLE_TABLET | ORAL | Status: DC
  Administered 2020-07-17: 80 mg via ORAL
  Filled 2020-07-17: qty 1

## 2020-07-17 MED ORDER — CLINDAMYCIN PHOSPHATE 900 MG/50ML IV SOLN
900.0000 mg | Freq: Three times a day (TID) | INTRAVENOUS | Status: AC
  Administered 2020-07-17 – 2020-07-18 (×4): 900 mg via INTRAVENOUS
  Filled 2020-07-17 (×4): qty 50

## 2020-07-17 MED ORDER — PRENATAL MULTIVITAMIN CH
1.0000 | ORAL_TABLET | Freq: Every day | ORAL | Status: DC
  Administered 2020-07-17 – 2020-07-20 (×4): 1 via ORAL
  Filled 2020-07-17 (×4): qty 1

## 2020-07-17 MED ORDER — ALBUMIN HUMAN 25 % IV SOLN: INTRAVENOUS | Status: DC | PRN

## 2020-07-17 MED ORDER — SCOPOLAMINE 1 MG/3DAYS TD PT72
MEDICATED_PATCH | TRANSDERMAL | Status: DC | PRN
  Administered 2020-07-17: 1 via TRANSDERMAL

## 2020-07-17 MED ORDER — DIBUCAINE (PERIANAL) 1 % EX OINT: 1.0000 "application " | TOPICAL_OINTMENT | CUTANEOUS | Status: DC | PRN

## 2020-07-17 MED ORDER — ACETAMINOPHEN 10 MG/ML IV SOLN
1000.0000 mg | Freq: Once | INTRAVENOUS | Status: DC | PRN
  Administered 2020-07-17: 1000 mg via INTRAVENOUS

## 2020-07-17 MED ORDER — SODIUM CHLORIDE 0.9 % IV SOLN: INTRAVENOUS | Status: DC | PRN

## 2020-07-17 MED ORDER — NALBUPHINE HCL 10 MG/ML IJ SOLN: 5.0000 mg | Freq: Once | INTRAMUSCULAR | Status: DC | PRN

## 2020-07-17 MED ORDER — DEXAMETHASONE SODIUM PHOSPHATE 4 MG/ML IJ SOLN
INTRAMUSCULAR | Status: DC | PRN
  Administered 2020-07-17: 4 mg via INTRAVENOUS

## 2020-07-17 MED ORDER — PRENATAL MULTIVITAMIN CH: 1.0000 | ORAL_TABLET | Freq: Every day | ORAL | Status: DC

## 2020-07-17 MED ORDER — DIPHENHYDRAMINE HCL 25 MG PO CAPS: 25.0000 mg | ORAL_CAPSULE | Freq: Four times a day (QID) | ORAL | Status: DC | PRN

## 2020-07-17 MED ORDER — MEPERIDINE HCL 25 MG/ML IJ SOLN: 6.2500 mg | INTRAMUSCULAR | Status: DC | PRN

## 2020-07-17 MED ORDER — SODIUM CHLORIDE 0.9% FLUSH: 3.0000 mL | INTRAVENOUS | Status: DC | PRN

## 2020-07-17 MED ORDER — ZOLPIDEM TARTRATE 5 MG PO TABS: 5.0000 mg | ORAL_TABLET | Freq: Every evening | ORAL | Status: DC | PRN

## 2020-07-17 MED ORDER — FENTANYL CITRATE (PF) 100 MCG/2ML IJ SOLN
INTRAMUSCULAR | Status: AC
  Filled 2020-07-17: qty 2

## 2020-07-17 MED ORDER — NALOXONE HCL 4 MG/10ML IJ SOLN
1.0000 ug/kg/h | INTRAVENOUS | Status: DC | PRN
  Filled 2020-07-17: qty 5

## 2020-07-17 MED ORDER — NALBUPHINE HCL 10 MG/ML IJ SOLN: 5.0000 mg | INTRAMUSCULAR | Status: DC | PRN

## 2020-07-17 MED ORDER — MORPHINE SULFATE (PF) 0.5 MG/ML IJ SOLN
INTRAMUSCULAR | Status: AC
  Filled 2020-07-17: qty 10

## 2020-07-17 MED ORDER — SIMETHICONE 80 MG PO CHEW
80.0000 mg | CHEWABLE_TABLET | Freq: Three times a day (TID) | ORAL | Status: DC
  Administered 2020-07-17 – 2020-07-20 (×9): 80 mg via ORAL
  Filled 2020-07-17 (×10): qty 1

## 2020-07-17 MED ORDER — DIPHENHYDRAMINE HCL 50 MG/ML IJ SOLN: 12.5000 mg | INTRAMUSCULAR | Status: DC | PRN

## 2020-07-17 MED ORDER — SIMETHICONE 80 MG PO CHEW: 80.0000 mg | CHEWABLE_TABLET | ORAL | Status: DC | PRN

## 2020-07-17 MED ORDER — LIDOCAINE-EPINEPHRINE (PF) 2 %-1:200000 IJ SOLN
INTRAMUSCULAR | Status: DC | PRN
  Administered 2020-07-17 (×3): 5 mL via INTRADERMAL
  Administered 2020-07-17: 2 mL via INTRADERMAL
  Administered 2020-07-17: 3 mL via INTRADERMAL

## 2020-07-17 MED ORDER — OXYTOCIN-SODIUM CHLORIDE 30-0.9 UT/500ML-% IV SOLN: 2.5000 [IU]/h | INTRAVENOUS | Status: AC

## 2020-07-17 MED ORDER — KETOROLAC TROMETHAMINE 30 MG/ML IJ SOLN
INTRAMUSCULAR | Status: AC
  Filled 2020-07-17: qty 1

## 2020-07-17 MED ORDER — SODIUM CHLORIDE 0.9 % IV SOLN
2.0000 g | Freq: Two times a day (BID) | INTRAVENOUS | Status: DC
  Administered 2020-07-17 – 2020-07-18 (×3): 2 g via INTRAVENOUS
  Filled 2020-07-17 (×4): qty 2

## 2020-07-17 MED ORDER — OXYTOCIN-SODIUM CHLORIDE 30-0.9 UT/500ML-% IV SOLN
INTRAVENOUS | Status: AC
  Filled 2020-07-17: qty 500

## 2020-07-17 MED ORDER — SCOPOLAMINE 1 MG/3DAYS TD PT72: 1.0000 | MEDICATED_PATCH | Freq: Once | TRANSDERMAL | Status: DC

## 2020-07-17 MED ORDER — DEXMEDETOMIDINE (PRECEDEX) IN NS 20 MCG/5ML (4 MCG/ML) IV SYRINGE
PREFILLED_SYRINGE | INTRAVENOUS | Status: DC | PRN
  Administered 2020-07-17 (×2): 8 ug via INTRAVENOUS
  Administered 2020-07-17: 4 ug via INTRAVENOUS

## 2020-07-17 MED ORDER — WITCH HAZEL-GLYCERIN EX PADS: 1.0000 "application " | MEDICATED_PAD | CUTANEOUS | Status: DC | PRN

## 2020-07-17 MED ORDER — ONDANSETRON HCL 4 MG/2ML IJ SOLN: 4.0000 mg | Freq: Once | INTRAMUSCULAR | Status: DC | PRN

## 2020-07-17 MED ORDER — METOCLOPRAMIDE HCL 5 MG/ML IJ SOLN
INTRAMUSCULAR | Status: DC | PRN
  Administered 2020-07-17: 10 mg via INTRAVENOUS

## 2020-07-17 MED ORDER — FAMOTIDINE 20 MG PO TABS: 20.0000 mg | ORAL_TABLET | Freq: Every day | ORAL | Status: DC | PRN

## 2020-07-17 MED ORDER — IBUPROFEN 800 MG PO TABS
800.0000 mg | ORAL_TABLET | Freq: Three times a day (TID) | ORAL | Status: AC
  Administered 2020-07-17 – 2020-07-20 (×8): 800 mg via ORAL
  Filled 2020-07-17 (×8): qty 1

## 2020-07-17 MED ORDER — OXYCODONE HCL 5 MG PO TABS
5.0000 mg | ORAL_TABLET | ORAL | Status: DC | PRN
  Administered 2020-07-18: 10 mg via ORAL
  Administered 2020-07-18 (×2): 5 mg via ORAL
  Administered 2020-07-18 – 2020-07-19 (×3): 10 mg via ORAL
  Administered 2020-07-20: 5 mg via ORAL
  Administered 2020-07-20: 10 mg via ORAL
  Filled 2020-07-17: qty 2
  Filled 2020-07-17 (×2): qty 1
  Filled 2020-07-17: qty 2
  Filled 2020-07-17: qty 1
  Filled 2020-07-17 (×3): qty 2

## 2020-07-17 MED ORDER — ENOXAPARIN SODIUM 40 MG/0.4ML ~~LOC~~ SOLN
40.0000 mg | SUBCUTANEOUS | Status: DC
  Administered 2020-07-17 – 2020-07-18 (×2): 40 mg via SUBCUTANEOUS
  Filled 2020-07-17 (×2): qty 0.4

## 2020-07-17 MED ORDER — OXYCODONE HCL 5 MG/5ML PO SOLN: 5.0000 mg | Freq: Once | ORAL | Status: DC | PRN

## 2020-07-17 MED ORDER — OXYTOCIN-SODIUM CHLORIDE 30-0.9 UT/500ML-% IV SOLN
INTRAVENOUS | Status: DC | PRN
  Administered 2020-07-17: 30 [IU] via INTRAVENOUS

## 2020-07-17 MED ORDER — MENTHOL 3 MG MT LOZG: 1.0000 | LOZENGE | OROMUCOSAL | Status: DC | PRN

## 2020-07-17 MED ORDER — ONDANSETRON HCL 4 MG/2ML IJ SOLN
INTRAMUSCULAR | Status: DC | PRN
  Administered 2020-07-17: 4 mg via INTRAVENOUS

## 2020-07-17 MED ORDER — LACTATED RINGERS IV SOLN: INTRAVENOUS | Status: DC

## 2020-07-17 MED ORDER — COCONUT OIL OIL
1.0000 "application " | TOPICAL_OIL | Status: DC | PRN
  Administered 2020-07-19: 1 via TOPICAL

## 2020-07-17 MED ORDER — LACTATED RINGERS IV SOLN: INTRAVENOUS | Status: DC | PRN

## 2020-07-17 MED ORDER — ONDANSETRON HCL 4 MG/2ML IJ SOLN: 4.0000 mg | Freq: Three times a day (TID) | INTRAMUSCULAR | Status: DC | PRN

## 2020-07-17 MED ORDER — FENTANYL CITRATE (PF) 100 MCG/2ML IJ SOLN
INTRAMUSCULAR | Status: DC | PRN
Start: 2020-07-17 — End: 2020-07-17
  Administered 2020-07-17: 100 ug via EPIDURAL
  Administered 2020-07-17 (×2): 50 ug via EPIDURAL

## 2020-07-17 MED ORDER — PHENYLEPHRINE HCL (PRESSORS) 10 MG/ML IV SOLN
INTRAVENOUS | Status: DC | PRN
  Administered 2020-07-17 (×3): 80 ug via INTRAVENOUS
  Administered 2020-07-17: 120 ug via INTRAVENOUS
  Administered 2020-07-17 (×2): 80 ug via INTRAVENOUS

## 2020-07-17 MED ORDER — MORPHINE SULFATE (PF) 0.5 MG/ML IJ SOLN
INTRAMUSCULAR | Status: DC | PRN
Start: 2020-07-17 — End: 2020-07-17
  Administered 2020-07-17: 2 mg via EPIDURAL
  Administered 2020-07-17: 3 mg via EPIDURAL

## 2020-07-17 MED ORDER — OXYCODONE HCL 5 MG PO TABS: 5.0000 mg | ORAL_TABLET | Freq: Once | ORAL | Status: DC | PRN

## 2020-07-17 MED ORDER — ACETAMINOPHEN 10 MG/ML IV SOLN
INTRAVENOUS | Status: AC
  Filled 2020-07-17: qty 100

## 2020-07-17 SURGICAL SUPPLY — 39 items
APL SKNCLS STERI-STRIP NONHPOA (GAUZE/BANDAGES/DRESSINGS) ×1
BENZOIN TINCTURE PRP APPL 2/3 (GAUZE/BANDAGES/DRESSINGS) ×3 IMPLANT
CHLORAPREP W/TINT 26ML (MISCELLANEOUS) ×3 IMPLANT
CLAMP CORD UMBIL (MISCELLANEOUS) IMPLANT
CLOSURE WOUND 1/2 X4 (GAUZE/BANDAGES/DRESSINGS) ×1
CLOTH BEACON ORANGE TIMEOUT ST (SAFETY) ×3 IMPLANT
DRSG OPSITE POSTOP 4X10 (GAUZE/BANDAGES/DRESSINGS) ×3 IMPLANT
ELECT REM PT RETURN 9FT ADLT (ELECTROSURGICAL) ×3
ELECTRODE REM PT RTRN 9FT ADLT (ELECTROSURGICAL) ×1 IMPLANT
EXTRACTOR VACUUM M CUP 4 TUBE (SUCTIONS) IMPLANT
EXTRACTOR VACUUM M CUP 4' TUBE (SUCTIONS)
GLOVE BIO SURGEON STRL SZ 6.5 (GLOVE) ×2 IMPLANT
GLOVE BIO SURGEONS STRL SZ 6.5 (GLOVE) ×1
GLOVE BIOGEL PI IND STRL 7.0 (GLOVE) ×1 IMPLANT
GLOVE BIOGEL PI INDICATOR 7.0 (GLOVE) ×2
GOWN STRL REUS W/TWL LRG LVL3 (GOWN DISPOSABLE) ×6 IMPLANT
HOVERMATT SINGLE USE (MISCELLANEOUS) ×2 IMPLANT
KIT ABG SYR 3ML LUER SLIP (SYRINGE) IMPLANT
NDL HYPO 25X5/8 SAFETYGLIDE (NEEDLE) IMPLANT
NEEDLE HYPO 25X5/8 SAFETYGLIDE (NEEDLE) IMPLANT
NS IRRIG 1000ML POUR BTL (IV SOLUTION) ×3 IMPLANT
PACK C SECTION WH (CUSTOM PROCEDURE TRAY) ×3 IMPLANT
PAD OB MATERNITY 4.3X12.25 (PERSONAL CARE ITEMS) ×3 IMPLANT
PENCIL SMOKE EVAC W/HOLSTER (ELECTROSURGICAL) ×3 IMPLANT
RTRCTR C-SECT PINK 25CM LRG (MISCELLANEOUS) ×3 IMPLANT
STRIP CLOSURE SKIN 1/2X4 (GAUZE/BANDAGES/DRESSINGS) ×2 IMPLANT
SUT MNCRL 0 VIOLET CTX 36 (SUTURE) ×2 IMPLANT
SUT MONOCRYL 0 CTX 36 (SUTURE) ×6
SUT PLAIN 1 NONE 54 (SUTURE) IMPLANT
SUT PLAIN 2 0 XLH (SUTURE) ×3 IMPLANT
SUT VIC AB 0 CT1 27 (SUTURE) ×6
SUT VIC AB 0 CT1 27XBRD ANBCTR (SUTURE) ×2 IMPLANT
SUT VIC AB 2-0 CT1 27 (SUTURE) ×3
SUT VIC AB 2-0 CT1 TAPERPNT 27 (SUTURE) ×1 IMPLANT
SUT VIC AB 4-0 KS 27 (SUTURE) ×3 IMPLANT
SYR BULB IRRIGATION 50ML (SYRINGE) ×3 IMPLANT
TOWEL OR 17X24 6PK STRL BLUE (TOWEL DISPOSABLE) ×3 IMPLANT
TRAY FOLEY W/BAG SLVR 14FR LF (SET/KITS/TRAYS/PACK) ×3 IMPLANT
WATER STERILE IRR 1000ML POUR (IV SOLUTION) ×3 IMPLANT

## 2020-07-17 NOTE — Progress Notes (Signed)
Notified Dr. Jackelyn Knife of HR of 125 while standing. Patient drinking well, has good urine output and blood pressures are stable. No new orders placed. Will continue to monitor. Earl Gala, Linda Hedges Madill

## 2020-07-17 NOTE — Transfer of Care (Signed)
Immediate Anesthesia Transfer of Care Note  Patient: [name]  Procedure(s) Performed: CESAREAN SECTION (N/A )  Patient Location: PACU  Anesthesia Type:Epidural  Level of Consciousness: awake, alert  and oriented  Airway & Oxygen Therapy: Patient Spontanous Breathing  Post-op Assessment: Report given to RN and Post -op Vital signs reviewed and stable  Post vital signs: Reviewed and stable  Last Vitals:  Vitals Value Taken Time  BP 127/68 07/17/20 0251  Temp    Pulse 150 07/17/20 0257  Resp 22 07/17/20 0257  SpO2 100 % 07/17/20 0257  Vitals shown include unvalidated device data.  Last Pain:  Vitals:   07/17/20 0100  TempSrc:   PainSc: 0-No pain      Patients Stated Pain Goal: 7 (07/16/20 2038)  Complications: No complications documented.

## 2020-07-17 NOTE — Lactation Note (Signed)
This note was copied from a baby's chart. Lactation Consultation Note  Patient Name: Emily Lowe [id number] Date: 07/17/2020 Reason for consult: Initial assessment;Term P1, 21 hour term female infant. Infant had 2 voids and 4 stools since birth. Per mom, she has DEBP at home. Mom feels breastfeeding is going well and  she feels only a  tug when infant is latched at the breast.   Infant is feeding 15 to 30 minutes most feedings. LC did not observe latch at this time due mom recently finished latching infant at breast prior to [facility] entering the room and dad holding infant in his arms. LC discussed hand expression and mom taught back, infant was given 1 mls of colostrum by spoon that mom had expressed.  LC did not observe any nipple trauma and nipples are everted outward. LC discussed benefits of STS with parents and parents plans to so more STS with infant. Mom will continue to BF infant according to cues, on demand, 8 to 12 times within 24 hours. Mom knows to call RN or LC if she has any questions, concerns or need assistance with latching infant at breast. Mom made aware of O/P services, breastfeeding support groups, community resources, and our phone # for post-discharge questions.   Maternal Data Formula Feeding for Exclusion: No Has patient been taught Hand Expression?: Yes Does the patient have breastfeeding experience prior to this delivery?: No  Feeding Feeding Type: Breast Fed  LATCH Score                   Interventions Interventions: Skin to skin;Expressed milk;Breast massage;Hand express  Lactation Tools Discussed/Used WIC Program: No   Consult Status Consult Status: Follow-up Date: 07/18/20 Follow-up type: In-patient    Danelle Earthly 07/17/2020, 11:46 PM

## 2020-07-17 NOTE — Brief Op Note (Signed)
07/17/2020  3:20 AM  PATIENT:  Emily Lowe  24 y.o. female  PRE-OPERATIVE DIAGNOSIS:  primary c/s for persistent category 2 strip; fetal tachycardia, maternal fever, triple I  POST-OPERATIVE DIAGNOSIS:  primary c/s for persistent category 2 strip; fetal tachycardia, maternal fever, triple I  PROCEDURE:  Procedure(s): CESAREAN SECTION (N/A)  SURGEON:  Surgeon(s) and Role:    * Bovard-Stuckert, Balthazar Dooly, MD - Primary   ANESTHESIA:   epidural  EBL:  1045 mL uop 150cc, IVF per anesthesia  BLOOD ADMINISTERED:none  FINDINGS: nl uterus, tubes, ovaries; viable female at 2:07, apgars 8 and 9, wr 8#1.3  DRAINS: Urinary Catheter (Foley)   LOCAL MEDICATIONS USED:  NONE  SPECIMEN:  Source of Specimen:  Placenta  DISPOSITION OF SPECIMEN:  PATHOLOGY  COUNTS:  YES  TOURNIQUET:  * No tourniquets in log *  DICTATION: .Other Dictation: Dictation Number [id number]  PLAN OF CARE: Admit to inpatient   PATIENT DISPOSITION:  PACU - hemodynamically stable.   Delay start of Pharmacological VTE agent (>24hrs) due to surgical blood loss or risk of bleeding: not applicable

## 2020-07-17 NOTE — Anesthesia Postprocedure Evaluation (Signed)
Anesthesia Post Note  Patient: Emily Lowe  Procedure(s) Performed: CESAREAN SECTION (N/A )     Patient location during evaluation: PACU Anesthesia Type: Epidural Level of consciousness: oriented and awake and alert Pain management: pain level controlled Vital Signs Assessment: post-procedure vital signs reviewed and stable Respiratory status: spontaneous breathing, respiratory function stable and nonlabored ventilation Cardiovascular status: blood pressure returned to baseline and stable Postop Assessment: no headache, no backache, no apparent nausea or vomiting and epidural receding Anesthetic complications: no   No complications documented.  Last Vitals:  Vitals:   07/17/20 0450 07/17/20 0550  BP: 118/69 (!) 116/55  Pulse: (!) 117 (!) 104  Resp: 18 18  Temp: 37.5 C 36.8 C  SpO2: 96% 95%    Last Pain:  Vitals:   07/17/20 0550  TempSrc: Axillary  PainSc: Asleep   Pain Goal: Patients Stated Pain Goal: 7 (07/16/20 2038)              Epidural/Spinal Function Cutaneous sensation: Able to Wiggle Toes (07/17/20 0550), Patient able to flex knees: Yes (07/17/20 0550), Patient able to lift hips off bed: No (07/17/20 0550), Back pain beyond tenderness at insertion site: No (07/17/20 0550), Progressively worsening motor and/or sensory loss: No (07/17/20 0550), Bowel and/or bladder incontinence post epidural: No (07/17/20 0550)  Lucretia Kern

## 2020-07-17 NOTE — Progress Notes (Signed)
Patient ID: Emily Lowe, female   DOB: [birth year], 24 y.o.   MRN: [id number]  FHTs 160's, min-mod var; category 2 toco q  D/W pt and FOB LTCS in light of fetal tachycardia, maternal fever - despite anti[yretic, fluid and antibiotic, dyfuctional contraction pattern.  D/W pt r/b/a of LTCS including: bleeding, infection and damage to surrounding organs, also difficulty healing and fetal injury.    Will proceed when OR ready.

## 2020-07-17 NOTE — Progress Notes (Signed)
POD #0 Doing ok, swelling in right hand Afeb since delivery, VSS Abd- soft, fundus firm, pressure dressing in place  Continue routine care.  On Cefotan/Gent/Clinda per JB, will continue today, hopefully d/c tomorrow morning if remains afebrile

## 2020-07-18 ENCOUNTER — Other Ambulatory Visit (HOSPITAL_COMMUNITY): Payer: Commercial Managed Care - PPO | Attending: Obstetrics and Gynecology

## 2020-07-18 MED ORDER — DOCUSATE SODIUM 100 MG PO CAPS
100.0000 mg | ORAL_CAPSULE | Freq: Two times a day (BID) | ORAL | Status: DC
  Administered 2020-07-18 – 2020-07-20 (×5): 100 mg via ORAL
  Filled 2020-07-18 (×5): qty 1

## 2020-07-18 NOTE — Progress Notes (Addendum)
Subjective: Postpartum Day 1: Cesarean Delivery Patient reports incisional pain, tolerating PO, + flatus and no problems voiding.   She denies fever, chills, nausea. She reports felt lightheaded yesterday - better today. She has no complaints of HA, CP  But reports mild SOB with ambulating around the room.   Objective: Vital signs in last 24 hours: Temp:  [97.8 F (36.6 C)-99.1 F (37.3 C)] 97.8 F (36.6 C) (08/23 6389) Pulse Rate:  [86-100] 100 (08/23 0611) Resp:  [16-18] 18 (08/23 0611) BP: (98-107)/(45-55) 100/45 (08/23 0611) SpO2:  [96 %-100 %] 100 % (08/23 3734)  Physical Exam:  General: alert, cooperative and mild distress Lochia: appropriate Uterine Fundus: firm; abdomen distended; dressing c/d/i Incision: healing well, no significant drainage DVT Evaluation: No evidence of DVT seen on physical exam.  Recent Labs    07/16/20 1930  HGB 9.9*  HCT 30.7*    Assessment/Plan: Status post Cesarean section. Postoperative course complicated by pain at incision and abdominal distension.              Continue current care; medication for gas relief; stool softener, add maternity belt; encourage ambulation. Intrapartum fever - Stop antibiotics after current running bag of cefotetin as >24hrs afebrile; monitor vitals Hypotensive - monitor for now   Cathrine Muster 07/18/2020, 9:50 AM

## 2020-07-18 NOTE — Op Note (Signed)
NAME: Emily Lowe, Emily Lowe MEDICAL RECORD ZD:[id number] ACCOUNT [id number] DATE OF BIRTH:27-Nov-1995 FACILITY: [facility] LOCATION: [facility] PHYSICIAN:Havard Radigan BOVARD-STUCKERT, MD  OPERATIVE REPORT  DATE OF PROCEDURE:  07/17/2020  PREOPERATIVE DIAGNOSES:  Primary cesarean section for persistent category 2 strip; fetal tachycardia; maternal fever; intrauterine inflammation, infection, or both.  POSTOPERATIVE DIAGNOSES:  Primary cesarean section for persistent category 2 strip; fetal tachycardia; maternal fever; intrauterine inflammation, infection, or both.  PROCEDURE:  Low transverse cesarean section.  SURGEON:  Sherian Rein, MD  ANESTHESIA:  Epidural.  ESTIMATED BLOOD LOSS:  1045 mL.  URINE OUTPUT:  150 mL clear urine at the end of the procedure.  INTRAVENOUS FLUIDS:  Per anesthesia.  COMPLICATIONS:  None.  PATHOLOGY:  Placenta to pathology.  DESCRIPTION OF PROCEDURE:  After informed consent was reviewed with the patient including risks, benefits and alternatives of the surgical procedure, she was transported to the operating room where her epidural was dosed and when it reached an adequate  surgical level, she was prepped and draped in the normal sterile fashion.  An appropriate timeout was performed and a Pfannenstiel skin incision was made at the level approximately 2 fingerbreadths above the pubic symphysis, carried through to the  underlying layer of fascia sharply.  The fascia was incised in the midline.  The midline and incision was extended laterally with Mayo scissors.  Superior aspect of the fascial incision was grasped with Kocher clamps, elevated and the rectus muscles were  dissected off both bluntly and sharply.  Midline was easily identified and entered bluntly.  The incision was extended superiorly and inferiorly with good visualization of the bladder.  An Alexis skin retractor was placed carefully making sure that no  bowel was entrapped.  The uterus was explored.  A  transverse incision was made in the lower uterine segment and infant was delivered from a vertex presentation.  Nose and mouth were suctioned on the field at rupture of membranes, moderate meconium was  noted.  The infant was dried after waiting a minute.  The cord was clamped and cut.  Infant was handed off to the waiting pediatric staff.  The placenta was expressed from the uterus.  Uterus was cleared of all clot and debris.  Uterine incision was  closed in 2 layers of 0 Monocryl, the first of which was running locked and the second as an imbricating layer.  The gutters were cleared of all clot and debris and normal postpartum anatomy was confirmed.  The Alexis retractor was removed carefully.   The peritoneum was reapproximated with 2-0 Vicryl in a running fashion.  The subfascial planes were inspected and found to be hemostatic.  Fascia was closed with a single suture of 0 Vicryl and the subcutaneous adipose layer was made hemostatic with  Bovie cautery.  The dead space was closed with plain gut.  The skin was reapproximated with 4-0 Vicryl on a Keith needle.  Benzoin and Steri-Strips were applied.  Sponge, lap and needle counts were correct x2 per the operating staff and the patient was  taken to the PACU in stable condition.  FINDINGS:  Normal uterus, tubes and ovaries.  Viable female infant at 2:07 a.m. with Apgars pending at the time of dictation; weight also pending.  JN/NUANCE  D:07/17/2020 T:07/18/2020 JOB:[id number]

## 2020-07-18 NOTE — Progress Notes (Signed)
CSW received consult for hx of OCD, Anxiety and Depression.  CSW met with MOB to offer support and complete assessment.    CSW congratulated MOB on the birth of infant. CSW advised MOB of CSW's role and the reason for CSW coming to visit with her. MOB reports that she was diagnose with her OCD in 2015. MOB reported that she in turn deals with anxiety and depression. MOB reported that she was on medications for a year in the past, however MOB reports not taking anything at this time. CSW offered MOB resources for therapy and medication management in which MOB declined. MOB reported that her anxiety and depression became worse during her pregnancy. MOB reported thoughts of harming self in the past but reports no SI and HI to this CSW at this time.   MOB reported that she has all needed items to care for self and reports that FOB is her support. MOB reported feelings of happiness and feeling relieved since giving birth.   CSW provided education regarding the baby blues period vs. perinatal mood disorders, discussed treatment and gave resources for mental health follow up if concerns arise.  CSW recommends self-evaluation during the postpartum time period using the New Mom Checklist from Postpartum Progress and encouraged MOB to contact a medical professional if symptoms are noted at any time.   CSW provided review of Sudden Infant Death Syndrome (SIDS) precautions.  MOB reported that infant would sleep in basinet once arrived home.   CSW identifies no further need for intervention and no barriers to discharge at this time.   Emily Lowe, MSW, LCSW [facility] [place] [phone]

## 2020-07-18 NOTE — Lactation Note (Signed)
This note was copied from a baby's chart. Lactation Consultation Note  Patient Name: Emily Lowe INOMV'E Date: 07/18/2020 Reason for consult: Follow-up assessment  Follow-up assessment of 43 hours baby Emily, breastfeed exclusively with 5.73% weight loss. Mother is a primipara, first-time breastfeeding.   Baby is resting in basinet upon arrival. Mother states breastfeeding is going well but she has a little challenge finding herself comfortable with positions due to her c/section. Talked about football position as more recommended. Mother reports that football position is working for her.    Encouraged mother to call lactation for support.     Feeding Feeding Type: Breast Fed  Interventions Interventions: Breast feeding basics reviewed;Position options  Consult Status Consult Status: Follow-up Date: 07/19/20 Follow-up type: In-patient    Emily Lowe 07/18/2020, 9:51 PM

## 2020-07-18 NOTE — Progress Notes (Signed)
Enc pt to shower in order to remove her pressure dsg. Pt was very guarded when this RN attempted to remove the dry pressure dsg.

## 2020-07-19 ENCOUNTER — Inpatient Hospital Stay (HOSPITAL_COMMUNITY): Payer: Commercial Managed Care - PPO

## 2020-07-19 ENCOUNTER — Inpatient Hospital Stay (HOSPITAL_COMMUNITY)
Admission: AD | Admit: 2020-07-19 | Payer: Commercial Managed Care - PPO | Source: Home / Self Care | Admitting: Obstetrics and Gynecology

## 2020-07-19 LAB — SURGICAL PATHOLOGY

## 2020-07-19 MED ORDER — IBUPROFEN 800 MG PO TABS
800.0000 mg | ORAL_TABLET | Freq: Three times a day (TID) | ORAL | 0 refills | Status: DC
Start: 2020-07-19 — End: 2023-01-01

## 2020-07-19 MED ORDER — OXYCODONE HCL 5 MG PO TABS: 5.0000 mg | ORAL_TABLET | ORAL | 0 refills | Status: DC | PRN

## 2020-07-19 NOTE — Progress Notes (Signed)
Baby is having some feeding issues, so mom and baby are not going home today

## 2020-07-19 NOTE — Progress Notes (Signed)
POD #2 LTCS Doing well, some pain, thinks she wants to go home today Afeb, VSS Abd- soft, fundus firm, dressing fairly saturated Continue routine care.  Will put in discharge orders, she might change her mind

## 2020-07-19 NOTE — Lactation Note (Signed)
This note was copied from a baby's chart. Lactation Consultation Note  Patient Name: Emily Lowe ZJIRC'V Date: 07/19/2020 Reason for consult: Follow-up assessment   P1, Baby 57 hours old.  9.6% weight loss.  1 void/1 stool.  Mother has large ebl with birth.  Noted pacifier in crib and new parents state they did not know to wait on pacifier use. They state they used it last night approx. Education provided. Nipples evert and compressible.  Reviewed hand expression with drops expressed bilaterally.   Observed feeding on both breasts.  Worked on depth and breast compression and support. Baby was recently supplemented with 15 ml of donor milk via curved tip syringe. Set up DEBP and recommend mother post pump every other feeding. Reviewed cleaning and milk storage.       Maternal Data    Feeding Feeding Type: Breast Fed  LATCH Score Latch: Grasps breast easily, tongue down, lips flanged, rhythmical sucking.  Audible Swallowing: Spontaneous and intermittent  Type of Nipple: Everted at rest and after stimulation  Comfort (Breast/Nipple): Soft / non-tender  Hold (Positioning): Assistance needed to correctly position infant at breast and maintain latch.  LATCH Score: 9  Interventions Interventions: Breast feeding basics reviewed;Hand express;Assisted with latch  Lactation Tools Discussed/Used     Consult Status Consult Status: Follow-up Date: 07/20/20 Follow-up type: In-patient    Dahlia Byes [facility] 07/19/2020, 12:06 PM

## 2020-07-19 NOTE — Discharge Instructions (Signed)
As per discharge pamphlet °

## 2020-07-19 NOTE — Progress Notes (Signed)
CSW followed back up with MOB at bedside to offer further support as CSW aware that MOB scored 22 on Edinburgh. MOB reported to this CSW that she is feeling "okay". CSW asked MOB is there anything in particular that is taking place for MOB and MOB reported "no". CSW again offered MOB further support in which MOB declined to this CSW. CSW expressed to RN that if MOB wishes to speak with CSW again to desires further resources, to please let CSW know.   Emily Lowe, MSW, LCSW [facility] [place] [phone]

## 2020-07-19 NOTE — Progress Notes (Signed)
Baby is feeding better, Emily Lowe continues to need encouragement to put baby to breast. FOB supportive, FOB feeding baby the donor milk with syringe, thanked me for my help.  Mom just sits there.  I got the baby to breast, Latch score 8.  I assured her she was doing great, she never smiled, and often doesn't respond to questions.  I have concerns and feel the FOB should be present for another SW consult, so he too will know of the resources that are available to them.   At shift change, I verbalized my concern to the new parents, I asked if they had help when they get home.  They said their Moms would be available to help out,  I think they all need to be better prepared to deal with PP Depression, and to be comfortable recognizing the warning signs.   Emily Lowe did not want the Lovenox shot, I encouraged her to walk in the hall, She complied.  Emily Hem RN

## 2020-07-20 NOTE — Lactation Note (Addendum)
This note was copied from a baby's chart. Lactation Consultation Note  Patient Name: Girl Sumeya Yontz BEEFE'O Date: 07/20/2020 Reason for consult: Follow-up assessment   Baby 77 hours old.  Weight increased slightly. Mother states she pumped x 1 due to be tired.  Family has been supplementing with 30-40 ml of DM.  Mother states baby breastfeeds but quickly falls asleep at the breast. Baby currently sleeping.  LC will follow up later today.   Mother has DEBP at home.   Received phone call from RN that mother needs no more direction from lactation before discharge today.   Maternal Data    Feeding Feeding Type: Breast Fed  LATCH Score                   Interventions Interventions: Breast feeding basics reviewed;DEBP  Lactation Tools Discussed/Used Pump Review: Milk Storage;Setup, frequency, and cleaning Initiated by:: Dahlia Byes RN IBCLC Date initiated:: 07/19/20   Consult Status Consult Status: Follow-up Date: 07/20/20 Follow-up type: In-patient    Dahlia Byes [facility] 07/20/2020, 7:54 AM

## 2020-07-20 NOTE — Progress Notes (Signed)
Subjective: Postpartum Day #3: Cesarean Delivery Patient reports tolerating PO, + BM and no problems voiding.  States using oxycodone for pain and helps though does not completely resolve.  Objective: Vital signs in last 24 hours: Temp:  [98.8 F (37.1 C)-98.9 F (37.2 C)] 98.8 F (37.1 C) (08/25 0546) Pulse Rate:  [76-110] 76 (08/25 0546) Resp:  [20] 20 (08/25 0546) BP: (106-112)/(49-63) 112/63 (08/25 0546) SpO2:  [100 %] 100 % (08/25 0546)  Physical Exam:  General: alert and cooperative Lochia: appropriate Uterine Fundus: firm Incision: C/D/I  No results for input(s): HGB, HCT in the last 72 hours.  Assessment/Plan: Status post Cesarean section. Doing well postoperatively. FOB states baby gained weight last PM so anticipate will be able to be d/c today Discharge home with standard precautions and return to clinic in 2 weeks.  Oliver Pila 07/20/2020, 8:49 AM

## 2020-07-20 NOTE — Discharge Summary (Signed)
Postpartum Discharge Summary       Patient Name: Emily Lowe DOB: 08-20-1996 MRN: [id number]  Date of admission: 07/16/2020 Delivery date:07/17/2020  Delivering provider: Sherian Rein  Date of discharge: 07/20/2020  Admitting diagnosis: Indication for care in labor or delivery [O75.9] Intrauterine pregnancy: [redacted]w[redacted]d     Secondary diagnosis:  Principal Problem:   Status post primary low transverse cesarean section Active Problems:   Indication for care in labor or delivery  Additional problems: chorioamnionitis    Discharge diagnosis: Term Pregnancy Delivered                                              Post partum procedures:antibiotics for chorioamnionitis Augmentation: AROM and Pitocin Complications: None  Hospital course: Onset of Labor With Unplanned C/S   24 y.o. yo G1P1001 at [redacted]w[redacted]d was admitted in Latent Labor on 07/16/2020. Patient had a labor course significant for fetal tachycardia and category 2 tracing, chorioamnionitis. The patient went for cesarean section due to persistent category 2 tracing. Delivery details as follows: Membrane Rupture Time/Date: 2:06 AM ,07/17/2020   Delivery Method:C-Section, Low Transverse  Details of operation can be found in separate operative note. Patient had an uncomplicated postpartum course.  She is ambulating,tolerating a regular diet, passing flatus, and urinating well.  Patient is discharged home in stable condition 07/20/20.  Newborn Data: Birth date:07/17/2020  Birth time:2:07 AM  Gender:Female  Living status:Living  Apgars:8 ,9  Weight:3665 g   Magnesium Sulfate received: No BMZ received: No Rhophylac:No   Physical exam  Vitals:   07/18/20 1427 07/19/20 0540 07/19/20 2138 07/20/20 0546  BP: 122/73 102/67 (!) 106/49 112/63  Pulse: 93 95 (!) 110 76  Resp: 18 20 20 20   Temp: 97.8 F (36.6 C) 98 F (36.7 C) 98.9 F (37.2 C) 98.8 F (37.1 C)  TempSrc: Oral Oral Oral Oral  SpO2: 100% 100% 100% 100%   Weight:      Height:       General: alert and cooperative Lochia: appropriate Uterine Fundus: firm Incision: Dressing is clean, dry, and intact  Labs: Lab Results  Component Value Date   WBC 15.4 (H) 07/16/2020   HGB 9.9 (L) 07/16/2020   HCT 30.7 (L) 07/16/2020   MCV 91.4 07/16/2020   PLT 143 (L) 07/16/2020   CMP Latest Ref Rng & Units 05/04/2020  Glucose 70 - 99 mg/dL 75  BUN 6 - 20 mg/dL 8  Creatinine 07/04/2020 - 2.69 mg/dL 4.85  Sodium 4.62 - 703 mmol/L 140  Potassium 3.5 - 5.1 mmol/L 3.9  Chloride 98 - 111 mmol/L 107  CO2 22 - 32 mmol/L 24  Calcium 8.9 - 10.3 mg/dL 9.5  Total Protein 6.5 - 8.1 g/dL 6.7  Total Bilirubin 0.3 - 1.2 mg/dL 500)  Alkaline Phos 38 - 126 U/L 80  AST 15 - 41 U/L 11(L)  ALT 0 - 44 U/L 12   Edinburgh Score: Edinburgh Postnatal Depression Scale Screening Tool 07/18/2020  I have been able to laugh and see the funny side of things. 1  I have looked forward with enjoyment to things. 1  I have blamed myself unnecessarily when things went wrong. 3  I have been anxious or worried for no good reason. 3  I have felt scared or panicky for no good reason. 3  Things have been getting on top of me.  3  I have been so unhappy that I have had difficulty sleeping. 2  I have felt sad or miserable. 2  I have been so unhappy that I have been crying. 2  The thought of harming myself has occurred to me. 2  Edinburgh Postnatal Depression Scale Total 22     After visit meds:  Allergies as of 07/20/2020   No Known Allergies     Medication List    STOP taking these medications   oxybutynin 5 MG tablet Commonly known as: DITROPAN   promethazine 12.5 MG tablet Commonly known as: PHENERGAN     TAKE these medications   acetaminophen 500 MG tablet Commonly known as: TYLENOL Take 500 mg by mouth every 6 (six) hours as needed for moderate pain.   B-12 1000 MCG Subl Place 1,000 mcg under the tongue daily.   dimenhyDRINATE 50 MG tablet Commonly known as:  DRAMAMINE Take 50 mg by mouth every 8 (eight) hours as needed.   doxylamine (Sleep) 25 MG tablet Commonly known as: UNISOM Take 25 mg by mouth at bedtime as needed.   famotidine 20 MG tablet Commonly known as: PEPCID Take 20 mg by mouth daily as needed for heartburn or indigestion.   ibuprofen 800 MG tablet Commonly known as: ADVIL Take 1 tablet (800 mg total) by mouth every 8 (eight) hours.   iron polysaccharides 150 MG capsule Commonly known as: NIFEREX Take 1 capsule (150 mg total) by mouth 2 (two) times daily.   oxyCODONE 5 MG immediate release tablet Commonly known as: Oxy IR/ROXICODONE Take 1 tablet (5 mg total) by mouth every 4 (four) hours as needed for severe pain.   prenatal multivitamin Tabs tablet Take 1 tablet by mouth daily at 12 noon.        Discharge home in stable condition Infant Feeding: Bottle and Breast Infant Disposition:home with mother Discharge instruction: per After Visit Summary and Postpartum booklet. Activity: Advance as tolerated. Pelvic rest for 6 weeks.  Diet: routine diet Future Appointments:No future appointments. Follow up Visit:  Follow-up Information    Meisinger, Todd, MD. Schedule an appointment as soon as possible for a visit in 2 week(s).   Specialty: Obstetrics and Gynecology Contact information: [address], [address] [place] [address] [phone]                Please schedule this patient for a In person postpartum visit in 2 weeks with the following provider: MD.  Delivery mode:  C-Section, Low Transverse  Anticipated Birth Control:  Unsure   07/20/2020 Oliver Pila, MD

## 2021-09-08 IMAGING — US US OB COMP LESS 14 WK
1 series · 15 of 19 positions shown · non-contrast
Comparison: None.

CLINICAL DATA: Pregnant patient in first-trimester pregnancy with
low back pain.

EXAM:
OBSTETRIC <14 WK ULTRASOUND
TECHNIQUE: Transabdominal ultrasound was performed for evaluation of the
gestation as well as the maternal uterus and adnexal regions.

[Series 1: us ob comp less 14 wk · 15 of 19 slices shown]
[im 1/19]
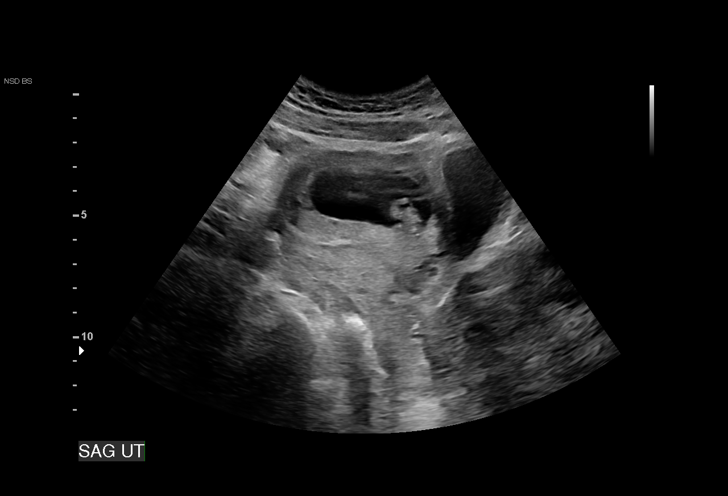
[im 2/19]
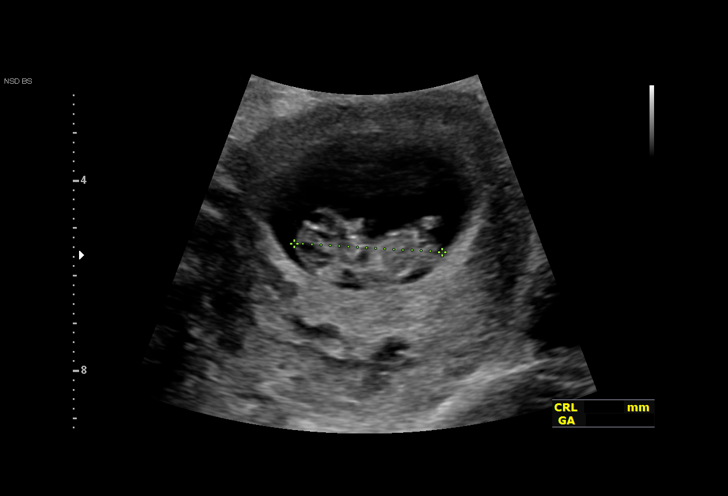
[im 4/19]
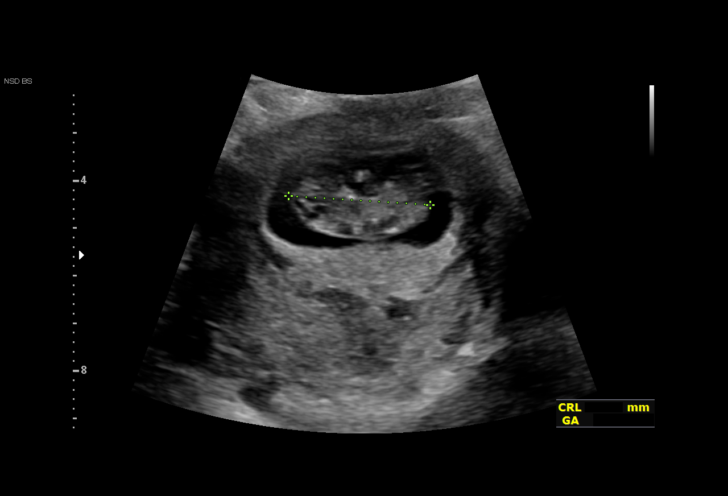
[im 5/19]
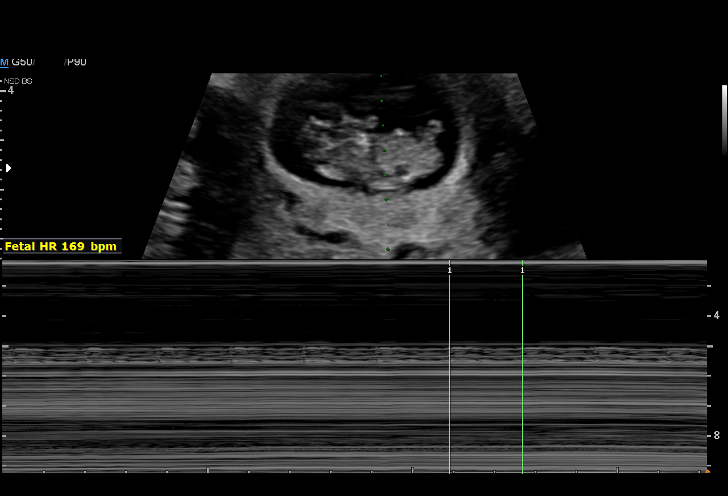
[im 6/19]
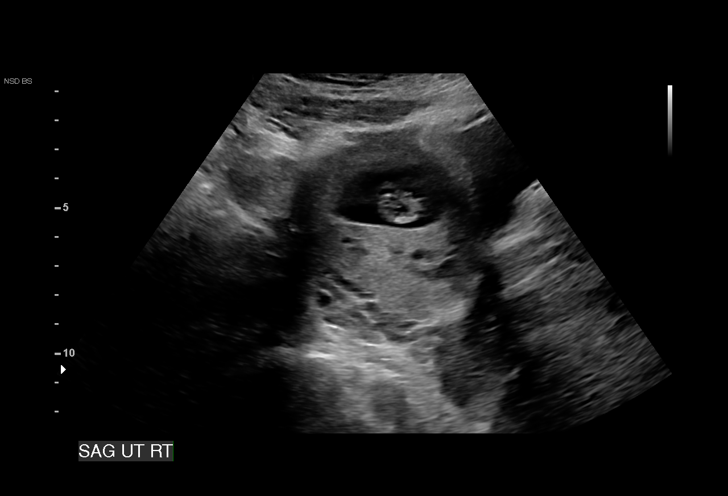
[im 7/19]
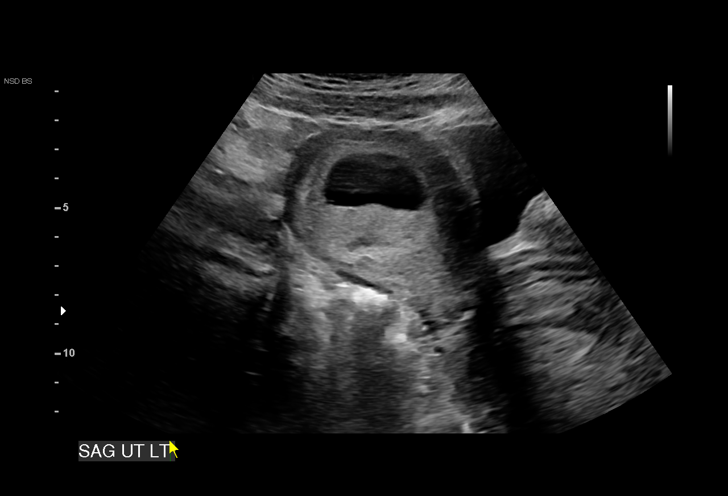
[im 9/19]
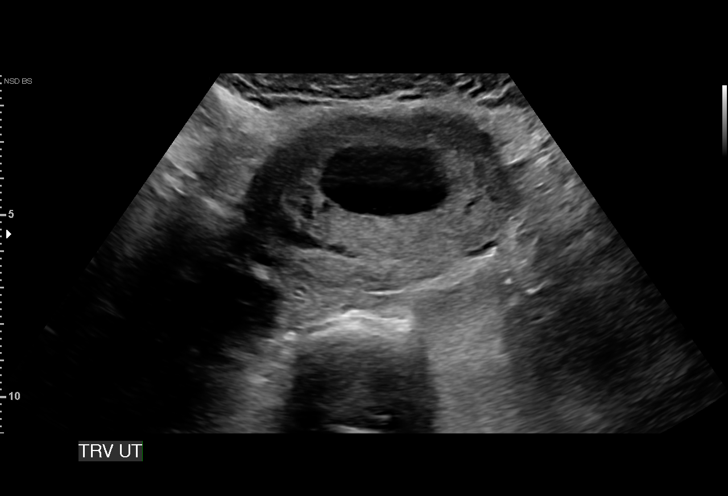
[im 10/19]
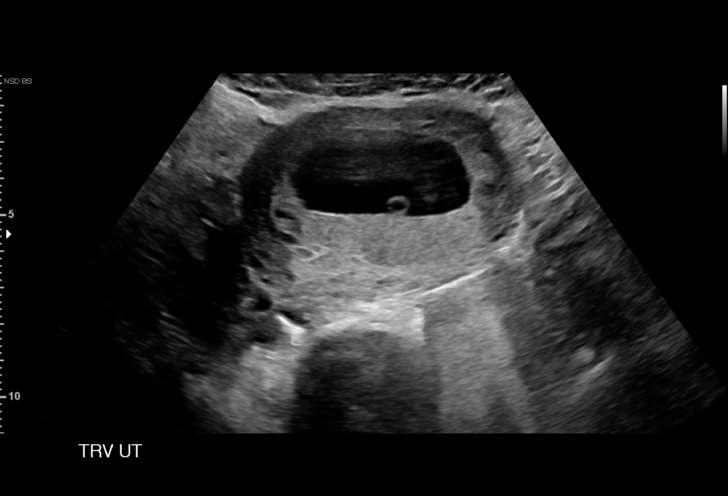
[im 11/19]
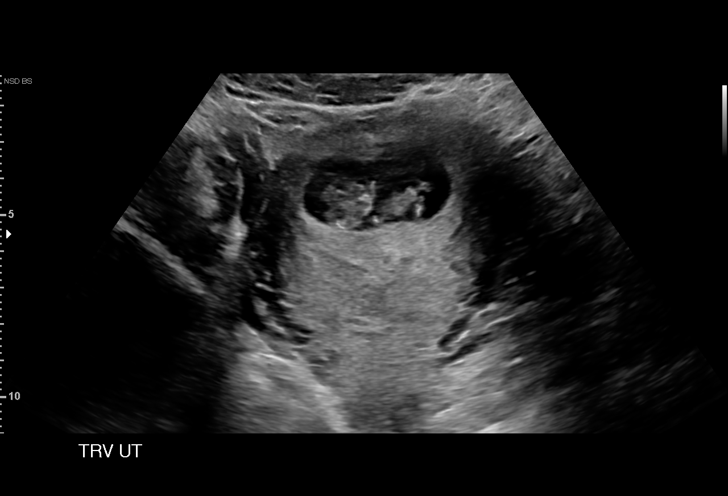
[im 13/19]
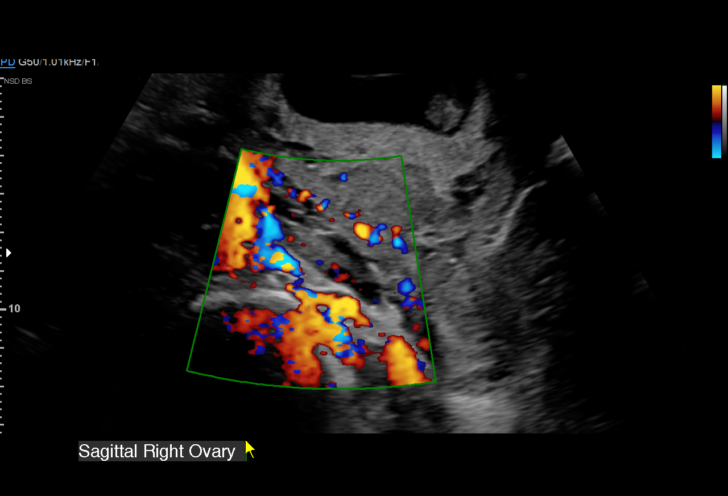
[im 14/19]
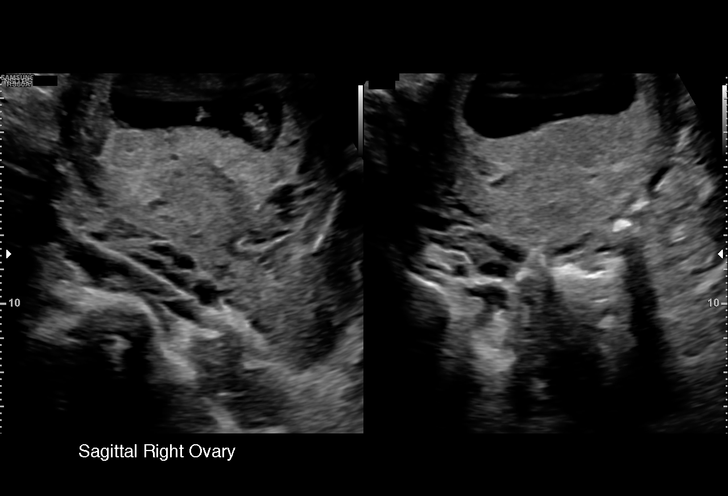
[im 15/19]
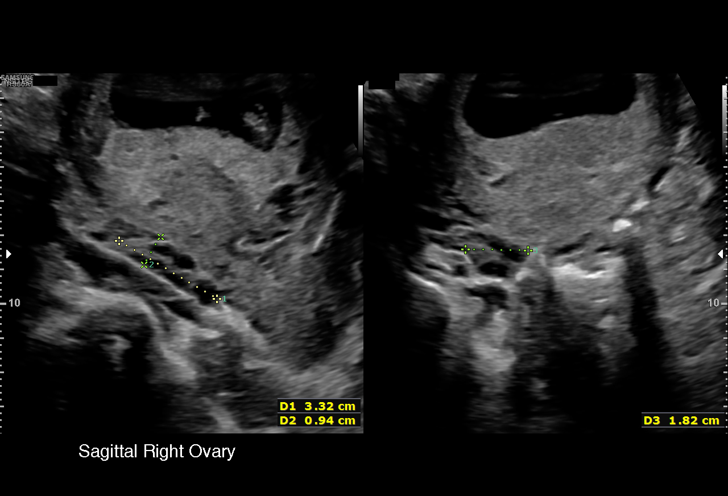
[im 16/19]
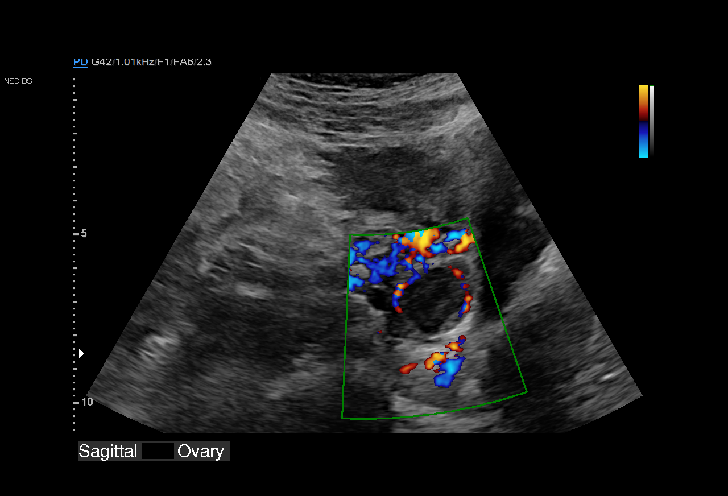
[im 18/19]
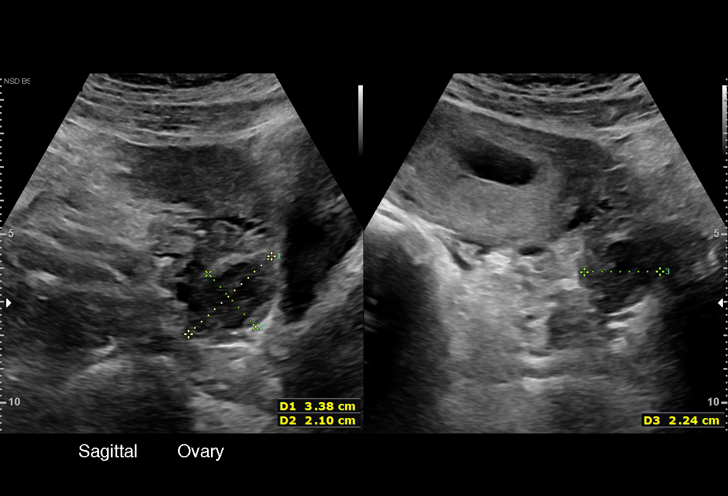
[im 19/19]
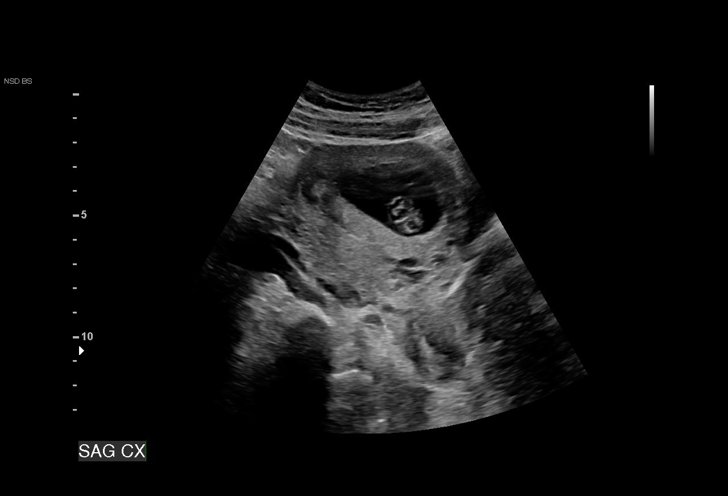

[15 of 19 positions shown; findings below may reference images not displayed]

FINDINGS: Intrauterine gestational sac: Single

Yolk sac:  Visualized.

Embryo:  Visualized.

Cardiac Activity: Visualized.

Heart Rate: 169 bpm

CRL:   30.3 mm   9 w 6 d                  US EDC: 07/12/2020

Subchorionic hemorrhage:  None visualized.

Maternal uterus/adnexae: Both ovaries are visualized and are normal.
Corpus luteal cyst in the left ovary. No pelvic free fluid or
adnexal mass.
IMPRESSION: 1. Single live intrauterine pregnancy estimated gestational age 9
weeks 6 days based on crown-rump length for ultrasound EDC
07/12/2020. No subchorionic hemorrhage.
2. Normal sonographic appearance of the ovaries.

## 2021-11-16 ENCOUNTER — Other Ambulatory Visit: Payer: Self-pay

## 2021-11-16 ENCOUNTER — Emergency Department (HOSPITAL_BASED_OUTPATIENT_CLINIC_OR_DEPARTMENT_OTHER): Payer: Self-pay

## 2021-11-16 ENCOUNTER — Encounter (HOSPITAL_BASED_OUTPATIENT_CLINIC_OR_DEPARTMENT_OTHER): Payer: Self-pay

## 2021-11-16 ENCOUNTER — Emergency Department (HOSPITAL_BASED_OUTPATIENT_CLINIC_OR_DEPARTMENT_OTHER)
Admission: EM | Admit: 2021-11-16 | Discharge: 2021-11-16 | Disposition: A | Discharge status: Home / Self Care | Payer: Self-pay | Attending: Emergency Medicine | Admitting: Emergency Medicine

## 2021-11-16 DIAGNOSIS — S61412A Laceration without foreign body of left hand, initial encounter: Secondary | ICD-10-CM

## 2021-11-16 DIAGNOSIS — W25XXXA Contact with sharp glass, initial encounter: Secondary | ICD-10-CM | POA: Insufficient documentation

## 2021-11-16 MED ORDER — TETANUS-DIPHTH-ACELL PERTUSSIS 5-2.5-18.5 LF-MCG/0.5 IM SUSY
0.5000 mL | PREFILLED_SYRINGE | Freq: Once | INTRAMUSCULAR | Status: DC
  Filled 2021-11-16: qty 0.5

## 2021-11-16 MED ORDER — LIDOCAINE-EPINEPHRINE (PF) 2 %-1:200000 IJ SOLN
20.0000 mL | Freq: Once | INTRAMUSCULAR | Status: AC
  Administered 2021-11-16: 20 mL
  Filled 2021-11-16: qty 20

## 2021-11-16 MED ORDER — BACITRACIN ZINC 500 UNIT/GM EX OINT: 1.0000 "application " | TOPICAL_OINTMENT | Freq: Two times a day (BID) | CUTANEOUS | 0 refills | Status: DC

## 2021-11-16 NOTE — ED Triage Notes (Signed)
Pt sustained a 1 inch lac to the L lateral palm from a broken jar. Wound is hemostatic at this time.

## 2021-11-16 NOTE — ED Provider Notes (Signed)
[facility] EMERGENCY DEPT Provider Note   CSN: [id number] Arrival date & time: 11/16/21  1843     History Chief Complaint  Patient presents with   Laceration    Emily Lowe is a 25 y.o. female. Patient presents the emergency department after sustaining a laceration to the left palm of her hand when opening a glass container.  She denies any sensation loss.  We will move all of her fingers and to grip normally.  She is unsure if there is any glass still in the wound.  Laceration     Past Medical History:  Diagnosis Date   Anxiety    Depression    couple months ago, never talked to anyone   Dyspnea    History of kidney stones    Infection    UTI   Kidney stones    followed by Dr. Achilles Dunk   Obsessive compulsive disorder 2016   OCD (obsessive compulsive disorder)    Seizures (HCC)    age 59- ? cause, only once   Status post primary low transverse cesarean section 07/17/2020    Patient Active Problem List   Diagnosis Date Noted   Status post primary low transverse cesarean section 07/17/2020   Indication for care in labor or delivery 07/16/2020   Hospital discharge follow-up 12/21/2019   Pregnancy complicated by nephrolithiasis in first trimester, antepartum 12/14/2019   Anxiety and depression 11/18/2018   Hearing loss due to cerumen impaction, left 11/18/2018   Contraception management 09/03/2016   OCD (obsessive compulsive disorder) 11/10/2015   Adjustment disorder with anxiety 02/08/2015   Kidney stones     Past Surgical History:  Procedure Laterality Date   CESAREAN SECTION N/A 07/17/2020   Procedure: CESAREAN SECTION;  Surgeon: Sherian Rein, MD;  Location: [facility] LD ORS;  Service: Obstetrics;  Laterality: N/A;   CYSTOSCOPY W/ URETERAL STENT PLACEMENT Right 03/18/2020   Procedure: CYSTOSCOPY WITH STENT REPLACEMENT;  Surgeon: Rene Paci, MD;  Location: [facility] ORS;  Service: Urology;  Laterality: Right;    CYSTOSCOPY/URETEROSCOPY/HOLMIUM LASER/STENT PLACEMENT Left 01/26/2020   Procedure: CYSTOSCOPY/URETEROSCOPY/HOLMIUM LASER/STENT PLACEMENT/REMOVAL PERCUTANEOUS NEPHROSTOMY TUBE LEFT;  Surgeon: Noel Christmas, MD;  Location: [facility] ORS;  Service: Urology;  Laterality: Left;  90 MINS   CYSTOSCOPY/URETEROSCOPY/HOLMIUM LASER/STENT PLACEMENT Bilateral 04/05/2020   Procedure: CYSTOSCOPY/URETEROSCOPY/HOLMIUM LASER/STENT PLACEMENT;  Surgeon: Noel Christmas, MD;  Location: [facility] ORS;  Service: Urology;  Laterality: Bilateral;  90 MINS   IR NEPHROSTOMY PLACEMENT LEFT  12/15/2019   WISDOM TOOTH EXTRACTION       OB History     Gravida  1   Para  1   Term  1   Preterm      AB      Living  1      SAB      IAB      Ectopic      Multiple  0   Live Births  1           Family History  Problem Relation Age of Onset   Hypertension Mother    Gout Father    Diabetes Father     Social History   Tobacco Use   Smoking status: Never   Smokeless tobacco: Never  Vaping Use   Vaping Use: Former   Devices: very brief, over a year ago stopped  Substance Use Topics   Alcohol use: Yes    Comment: Occas.   Drug use: No    Home Medications Prior to Admission medications  Medication Sig Start Date End Date Taking? Authorizing Provider  bacitracin ointment Apply 1 application topically 2 (two) times daily. 11/16/21  Yes Leticia Coletta, Finis Bud, PA-C  acetaminophen (TYLENOL) 500 MG tablet Take 500 mg by mouth every 6 (six) hours as needed for moderate pain.     [provider]  Cyanocobalamin (B-12) 1000 MCG SUBL Place 1,000 mcg under the tongue daily. 05/11/20   Johney Maine, MD  dimenhyDRINATE (DRAMAMINE) 50 MG tablet Take 50 mg by mouth every 8 (eight) hours as needed.    [provider]  doxylamine, Sleep, (UNISOM) 25 MG tablet Take 25 mg by mouth at bedtime as needed.    [provider]  famotidine (PEPCID) 20 MG tablet Take 20 mg by mouth daily as needed for  heartburn or indigestion.    [provider]  ibuprofen (ADVIL) 800 MG tablet Take 1 tablet (800 mg total) by mouth every 8 (eight) hours. 07/19/20   Meisinger, Tawanna Cooler, MD  iron polysaccharides (NIFEREX) 150 MG capsule Take 1 capsule (150 mg total) by mouth 2 (two) times daily. 05/04/20   Johney Maine, MD  oxyCODONE (OXY IR/ROXICODONE) 5 MG immediate release tablet Take 1 tablet (5 mg total) by mouth every 4 (four) hours as needed for severe pain. 07/19/20   Meisinger, Tawanna Cooler, MD  Prenatal Vit-Fe Fumarate-FA (PRENATAL MULTIVITAMIN) TABS tablet Take 1 tablet by mouth daily at 12 noon.    [provider]  JUNEL FE 1/20 1-20 MG-MCG tablet TAKE 1 TABLET BY MOUTH ONCE DAILY Patient not taking: Reported on 07/20/2019 08/15/18 07/20/19  Dianne Dun, MD  sertraline (ZOLOFT) 50 MG tablet TAKE 1 TABLET BY MOUTH ONCE DAILY Patient not taking: Reported on 07/20/2019 10/13/18 07/20/19  Dianne Dun, MD    Allergies    Patient has no known allergies.  Review of Systems   Review of Systems  Skin:  Positive for wound.  Neurological:  Negative for weakness and numbness.  All other systems reviewed and are negative.  Physical Exam Updated Vital Signs BP 128/74 (BP Location: Right Arm)    Pulse (!) 104    Temp 98.4 F (36.9 C) (Oral)    Resp 20    Ht 5' (1.524 m)    Wt 64.4 kg    LMP 10/24/2021    SpO2 100%    BMI 27.73 kg/m   Physical Exam Vitals and nursing note reviewed.  Constitutional:      General: She is not in acute distress.    Appearance: Normal appearance. She is well-developed. She is not ill-appearing, toxic-appearing or diaphoretic.  HENT:     Head: Normocephalic and atraumatic.     Nose: No nasal deformity.     Mouth/Throat:     Lips: Pink. No lesions.  Eyes:     General: Gaze aligned appropriately. No scleral icterus.       Right eye: No discharge.        Left eye: No discharge.     Conjunctiva/sclera: Conjunctivae normal.     Right eye: Right conjunctiva is not  injected. No exudate or hemorrhage.    Left eye: Left conjunctiva is not injected. No exudate or hemorrhage. Pulmonary:     Effort: Pulmonary effort is normal. No respiratory distress.  Skin:    General: Skin is warm and dry.     Findings: Laceration present.     Comments: There is a 1 inch linear laceration to the left lateral palm.  It is fairly superficial  and able to visualize entire depth of wound.  Fairly well approximated.  No drainage from the injury.  Patient with full range of motion of hand and grip strength.  Capillary refill intact.  Sensation grossly intact.  Neurological:     Mental Status: She is alert and oriented to person, place, and time.  Psychiatric:        Mood and Affect: Mood normal.        Speech: Speech normal.        Behavior: Behavior normal. Behavior is cooperative.    ED Results / Procedures / Treatments   Labs (all labs ordered are listed, but only abnormal results are displayed) Labs Reviewed - No data to display  EKG None  Radiology DG Hand Complete Left  Result Date: 11/16/2021 CLINICAL DATA:  Foreign body evaluation. EXAM: LEFT HAND - COMPLETE 3+ VIEW COMPARISON:  None. FINDINGS: There is no evidence of fracture or dislocation. There is no evidence of arthropathy or other focal bone abnormality. Soft tissues are unremarkable. No evidence for foreign body. IMPRESSION: Negative. Electronically Signed   By: Darliss Cheney M.D.   On: 11/16/2021 21:37    Procedures .Marland KitchenLaceration Repair  Date/Time: 11/16/2021 10:08 PM Performed by: Claudie Leach, PA-C Authorized by: Claudie Leach, PA-C   Consent:    Consent obtained:  Verbal   Consent given by:  Patient   Risks, benefits, and alternatives were discussed: yes     Risks discussed:  Infection, pain, retained foreign body, tendon damage, poor wound healing, poor cosmetic result, need for additional repair, nerve damage and vascular damage   Alternatives discussed:  No treatment Universal  protocol:    Procedure explained and questions answered to patient or proxy's satisfaction: yes     Patient identity confirmed:  Verbally with patient Anesthesia:    Anesthesia method:  None Laceration details:    Location:  Hand   Hand location:  L palm   Length (cm):  1 Pre-procedure details:    Preparation:  Imaging obtained to evaluate for foreign bodies Exploration:    Imaging obtained: x-ray     Imaging outcome: foreign body not noted     Wound exploration: entire depth of wound visualized   Skin repair:    Repair method:  Tissue adhesive Approximation:    Approximation:  Close Repair type:    Repair type:  Simple Post-procedure details:    Dressing:  Non-adherent dressing   Procedure completion:  Tolerated   Medications Ordered in ED Medications  lidocaine-EPINEPHrine (XYLOCAINE W/EPI) 2 %-1:200000 (PF) injection 20 mL (20 mLs Infiltration Given by Other 11/16/21 2102)    ED Course  I have reviewed the triage vital signs and the nursing notes.  Pertinent labs & imaging results that were available during my care of the patient were reviewed by me and considered in my medical decision making (see chart for details).    MDM Rules/Calculators/A&P                           This is a well-appearing 25 year old female presents with a laceration to her left lateral palm.  She is neurovascularly intact.  No tendon or ligamental damage.  Laceration is fairly superficial with well approximation. Low tension area. Unclear if there is still residual glass in wound. Obtaining x-ray.  X-ray negative for foreign body. Wound cleansed thoroughly.  Dermabond in place with good closure. Wound care instructions provided to patient. Bacitracin ointment prescribed.  Final Clinical Impression(s) / ED Diagnoses Final diagnoses:  Laceration of left hand without foreign body, initial encounter    Rx / DC Orders ED Discharge Orders          Ordered    bacitracin ointment  2 times  daily        11/16/21 2156             Therese Sarah 11/16/21 2209    Cathren Laine, MD 11/21/21 1623

## 2021-11-16 NOTE — ED Notes (Signed)
Pt hand was cleaned with saline and wrapped back.

## 2021-11-16 NOTE — ED Notes (Signed)
Pt hand wrapped prior to discharge.

## 2021-11-16 NOTE — Discharge Instructions (Addendum)
Please use Bacitracin ointment over wound twice a day. If you notice signs of infection, please return to be evaluated by a medical provider.

## 2022-02-15 NOTE — Progress Notes (Signed)
New Patient Office Visit  Subjective:  Patient ID: Emily Lowe, female    DOB: [birth year]  Age: 26 y.o. MRN: [id number]  CC:  Chief Complaint  Patient presents with   Establish Care    Np. Est care. Pt c/o sharp/dull pain in left chest area, has subsided within the last month. Pt states she also experienced possible heart flutter for several months.     HPI Emily Lowe presents for new patient visit to establish care.  Introduced to Publishing rights manager role and practice setting.  All questions answered.  Discussed provider/patient relationship and expectations.  Emily Lowe has a history of heart palpitations.  She states that they normally last only a few seconds.  Then a few months ago she endorses heart fluttering that was associated with light-headedness. The episode lasted a few minutes and then went away. She drinks 1-2 cups of coffee every morning. Endorses some stress at home.  This has not happened since then.  She also endorses left lung pain that was sharp that would last for a few minutes. This started about a month ago. She states that it happened off and on for about 2 weeks. She states that she felt like her lung "popped." As soon as the pop happened the pain went away. Denies any injuries. She denies shortness of breath and pain today.    She has a history of OCD. She tends to wash her hands frequently. She was taking sertraline several years ago. She recently stopped lexapro. She stated that the medication made her not feel like herself and didn't help. She has done therapy in the past which helped her symptoms. She denies SI/HI. She did have some of these thoughts a few years ago, however not as often now.  She is not currently interested in starting any new medications at this point, she feels like her symptoms are pretty well controlled.     02/16/2022    2:00 PM 11/18/2018    8:32 AM 08/22/2017    8:26 AM  Depression screen PHQ 2/9  Decreased Interest 1 1 0  Down,  Depressed, Hopeless 1 1 0  PHQ - 2 Score 2 2 0  Altered sleeping 2 2   Tired, decreased energy 2 1   Change in appetite 2 1   Feeling bad or failure about yourself  2 1   Trouble concentrating 2 2   Moving slowly or fidgety/restless 0 2   Suicidal thoughts 1 1   PHQ-9 Score 13 12   Difficult doing work/chores Very difficult Somewhat difficult       02/16/2022    2:00 PM 11/18/2018    8:32 AM  GAD 7 : Generalized Anxiety Score  Nervous, Anxious, on Edge 2 3  Control/stop worrying 3 3  Worry too much - different things 3 3  Trouble relaxing 3 3  Restless 1 1  Easily annoyed or irritable 2 1  Afraid - awful might happen 1 1  Total GAD 7 Score 15 15  Anxiety Difficulty Very difficult Somewhat difficult    Past Medical History:  Diagnosis Date   Anxiety    Depression    couple months ago, never talked to anyone   Dyspnea    History of kidney stones    Infection    UTI   Kidney stones    followed by Dr. Achilles Dunk   Obsessive compulsive disorder 2016   OCD (obsessive compulsive disorder)    Seizures (HCC)  age 85- ? cause, only once   Status post primary low transverse cesarean section 07/17/2020    Past Surgical History:  Procedure Laterality Date   CESAREAN SECTION N/A 07/17/2020   Procedure: CESAREAN SECTION;  Surgeon: Sherian Rein, MD;  Location: [facility] LD ORS;  Service: Obstetrics;  Laterality: N/A;   CYSTOSCOPY W/ URETERAL STENT PLACEMENT Right 03/18/2020   Procedure: CYSTOSCOPY WITH STENT REPLACEMENT;  Surgeon: Rene Paci, MD;  Location: [facility] ORS;  Service: Urology;  Laterality: Right;   CYSTOSCOPY/URETEROSCOPY/HOLMIUM LASER/STENT PLACEMENT Left 01/26/2020   Procedure: CYSTOSCOPY/URETEROSCOPY/HOLMIUM LASER/STENT PLACEMENT/REMOVAL PERCUTANEOUS NEPHROSTOMY TUBE LEFT;  Surgeon: Noel Christmas, MD;  Location: [facility] ORS;  Service: Urology;  Laterality: Left;  90 MINS   CYSTOSCOPY/URETEROSCOPY/HOLMIUM LASER/STENT PLACEMENT Bilateral 04/05/2020   Procedure:  CYSTOSCOPY/URETEROSCOPY/HOLMIUM LASER/STENT PLACEMENT;  Surgeon: Noel Christmas, MD;  Location: [facility]S;  Service: Urology;  Laterality: Bilateral;  90 MINS   IR NEPHROSTOMY PLACEMENT LEFT  12/15/2019   WISDOM TOOTH EXTRACTION      Family History  Problem Relation Age of Onset   Hypertension Mother    Gout Father    Diabetes Father     Social History   Socioeconomic History   Marital status: Single    Spouse name: Not on file   Number of children: 0   Years of education: Not on file   Highest education level: Bachelor's degree (e.g., BA, AB, BS)  Occupational History   Not on file  Tobacco Use   Smoking status: Never   Smokeless tobacco: Never  Vaping Use   Vaping Use: Former   Devices: very brief, over a year ago stopped  Substance and Sexual Activity   Alcohol use: Yes    Comment: Occas.   Drug use: No   Sexual activity: Not Currently    Partners: Male  Other Topics Concern   Not on file  Social History Narrative   Not on file   Social Determinants of Health   Financial Resource Strain: Not on file  Food Insecurity: Not on file  Transportation Needs: Not on file  Physical Activity: Not on file  Stress: Not on file  Social Connections: Not on file  Intimate Partner Violence: Not on file    ROS Review of Systems  Constitutional:  Positive for fatigue.  HENT:  Positive for ear pain. Negative for congestion and sore throat.   Eyes: Negative.   Respiratory: Negative.    Cardiovascular:  Positive for palpitations. Negative for chest pain.  Gastrointestinal: Negative.   Genitourinary: Negative.   Musculoskeletal: Negative.   Skin: Negative.   Neurological:  Positive for dizziness (not as frequent) and headaches.  Psychiatric/Behavioral:  The patient is nervous/anxious.    Objective:   Today's Vitals: BP 124/78 (BP Location: Left Arm, Patient Position: Sitting, Cuff Size: Normal)   Pulse 71   Temp (!) 97.2 F (36.2 C) (Temporal)   Ht 5' (1.524 m)   Wt  148 lb (67.1 kg)   LMP 02/12/2022 (Exact Date)   SpO2 99%   BMI 28.90 kg/m   Physical Exam Vitals and nursing note reviewed.  Constitutional:      General: She is not in acute distress.    Appearance: Normal appearance.  HENT:     Head: Normocephalic and atraumatic.     Right Ear: Tympanic membrane, ear canal and external ear normal.     Left Ear: Tympanic membrane, ear canal and external ear normal.     Nose: Nose normal.  Mouth/Throat:     Mouth: Mucous membranes are moist.     Pharynx: Oropharynx is clear.  Eyes:     Conjunctiva/sclera: Conjunctivae normal.  Cardiovascular:     Rate and Rhythm: Normal rate and regular rhythm.     Pulses: Normal pulses.     Heart sounds: Normal heart sounds.  Pulmonary:     Effort: Pulmonary effort is normal.     Breath sounds: Normal breath sounds.  Abdominal:     General: Bowel sounds are normal.     Palpations: Abdomen is soft.     Tenderness: There is no abdominal tenderness.  Musculoskeletal:        General: Normal range of motion.     Cervical back: Normal range of motion. No tenderness.     Right lower leg: No edema.     Left lower leg: No edema.  Lymphadenopathy:     Cervical: No cervical adenopathy.  Skin:    General: Skin is warm and dry.  Neurological:     General: No focal deficit present.     Mental Status: She is alert and oriented to person, place, and time.     Cranial Nerves: No cranial nerve deficit.     Coordination: Coordination normal.     Gait: Gait normal.  Psychiatric:        Mood and Affect: Mood normal.        Behavior: Behavior normal.        Thought Content: Thought content normal.        Judgment: Judgment normal.    Assessment & Plan:   Problem List Items Addressed This Visit       Genitourinary   Kidney stones    History of kidney stones.  She states that she never had problems with blockages until she was pregnant.  She has a history of ureteral stents from these kidney stones while  pregnant.  She has not had any issues recently.  Follow-up with any concerns.        Other   OCD (obsessive compulsive disorder)    She has a history of OCD along with anxiety and depression.  She was taking Zoloft and Lexapro in the past for this, however she did not like the way that the medications made her feel.  She states that her symptoms are pretty well controlled at the moment and she does not need any medication.  She will reach out in the future if she does.  Follow-up with any concerns      Anxiety and depression    She has a history of anxiety and depression along with OCD.  She was seeing a therapist in the past.  She has also tried sertraline and Lexapro for these conditions which she states made her not feel like herself.  Her PHQ-9 is a 13 and GAD-7 is a 15.  She even with these elevated scores, she states that her symptoms are stable.  She is not interested in therapy or medications right now.  She denies SI/HI.  Follow-up if symptoms worsen or with any concerns.      Palpitations - Primary    She has a history of palpitations however about 2 months ago she noticed that she had a little bit longer of an episode along with some lightheadedness.  It went away on its own.  She has not had any palpitations since then.  We will check CMP, CBC, TSH today.  If she starts having more palpitations  again, reach out and schedule a follow-up appointment.      Relevant Orders   CBC with Differential/Platelet   Comprehensive metabolic panel   TSH   Chest wall pain    She had a episode of what she calls lung pain 2 months ago.  This lasted intermittently for about 2 weeks, then she felt a popping sound and then the pain went away.  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  Also discussed that this could be rib pain, or anxiety.  She is currently not having any pain today.  Follow-up if the symptoms happen again.       Outpatient Encounter  Medications as of 02/16/2022  Medication Sig   acetaminophen (TYLENOL) 500 MG tablet Take 500 mg by mouth every 6 (six) hours as needed for moderate pain.    ibuprofen (ADVIL) 800 MG tablet Take 1 tablet (800 mg total) by mouth every 8 (eight) hours.   [DISCONTINUED] bacitracin ointment Apply 1 application topically 2 (two) times daily.   [DISCONTINUED] Cyanocobalamin (B-12) 1000 MCG SUBL Place 1,000 mcg under the tongue daily.   [DISCONTINUED] dimenhyDRINATE (DRAMAMINE) 50 MG tablet Take 50 mg by mouth every 8 (eight) hours as needed.   [DISCONTINUED] doxylamine, Sleep, (UNISOM) 25 MG tablet Take 25 mg by mouth at bedtime as needed.   [DISCONTINUED] famotidine (PEPCID) 20 MG tablet Take 20 mg by mouth daily as needed for heartburn or indigestion.   [DISCONTINUED] iron polysaccharides (NIFEREX) 150 MG capsule Take 1 capsule (150 mg total) by mouth 2 (two) times daily.   [DISCONTINUED] JUNEL FE 1/20 1-20 MG-MCG tablet TAKE 1 TABLET BY MOUTH ONCE DAILY (Patient not taking: Reported on 07/20/2019)   [DISCONTINUED] oxyCODONE (OXY IR/ROXICODONE) 5 MG immediate release tablet Take 1 tablet (5 mg total) by mouth every 4 (four) hours as needed for severe pain.   [DISCONTINUED] Prenatal Vit-Fe Fumarate-FA (PRENATAL MULTIVITAMIN) TABS tablet Take 1 tablet by mouth daily at 12 noon.   [DISCONTINUED] sertraline (ZOLOFT) 50 MG tablet TAKE 1 TABLET BY MOUTH ONCE DAILY (Patient not taking: Reported on 07/20/2019)   No facility-administered encounter medications on file as of 02/16/2022.    Follow-up: Return in about 1 year (around 02/17/2023) for CPE.   Gerre Scull, NP

## 2022-02-16 ENCOUNTER — Ambulatory Visit (INDEPENDENT_AMBULATORY_CARE_PROVIDER_SITE_OTHER): Payer: Self-pay | Admitting: Nurse Practitioner

## 2022-02-16 ENCOUNTER — Encounter: Payer: Self-pay | Admitting: Nurse Practitioner

## 2022-02-16 VITALS — BP 124/78 | HR 71 | Temp 97.2°F | Ht 60.0 in | Wt 148.0 lb

## 2022-02-16 DIAGNOSIS — F429 Obsessive-compulsive disorder, unspecified: Secondary | ICD-10-CM

## 2022-02-16 DIAGNOSIS — R002 Palpitations: Secondary | ICD-10-CM

## 2022-02-16 DIAGNOSIS — N200 Calculus of kidney: Secondary | ICD-10-CM

## 2022-02-16 DIAGNOSIS — R0789 Other chest pain: Secondary | ICD-10-CM

## 2022-02-16 DIAGNOSIS — F32A Depression, unspecified: Secondary | ICD-10-CM

## 2022-02-16 DIAGNOSIS — F419 Anxiety disorder, unspecified: Secondary | ICD-10-CM

## 2022-02-16 DIAGNOSIS — R7989 Other specified abnormal findings of blood chemistry: Secondary | ICD-10-CM

## 2022-02-16 NOTE — Patient Instructions (Signed)
It was great to see you! ? ?I have attached some stretches for you to do every day to help your back and sciatica. We are checking some labs today and will let you know the results.  ? ?Let's follow-up in 1 year, sooner if you have concerns. ? ?If a referral was placed today, you will be contacted for an appointment. Please note that routine referrals can sometimes take up to 3-4 weeks to process. Please call our office if you haven't heard anything after this time frame. ? ?Take care, ? ?Rodman Pickle, NP ? ?

## 2022-02-16 NOTE — Assessment & Plan Note (Signed)
She has a history of OCD along with anxiety and depression.  She was taking Zoloft and Lexapro in the past for this, however she did not like the way that the medications made her feel.  She states that her symptoms are pretty well controlled at the moment and she does not need any medication.  She will reach out in the future if she does.  Follow-up with any concerns ?

## 2022-02-16 NOTE — Assessment & Plan Note (Signed)
She had a episode of what she calls lung pain 2 months ago.  This lasted intermittently for about 2 weeks, then she felt a popping sound and then the pain went away.  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  Also discussed that this could be rib pain, or anxiety.  She is currently not having any pain today.  Follow-up if the symptoms happen again. ?

## 2022-02-16 NOTE — Assessment & Plan Note (Signed)
History of kidney stones.  She states that she never had problems with blockages until she was pregnant.  She has a history of ureteral stents from these kidney stones while pregnant.  She has not had any issues recently.  Follow-up with any concerns. ?

## 2022-02-16 NOTE — Assessment & Plan Note (Signed)
She has a history of palpitations however about 2 months ago she noticed that she had a little bit longer of an episode along with some lightheadedness.  It went away on its own.  She has not had any palpitations since then.  We will check CMP, CBC, TSH today.  If she starts having more palpitations again, reach out and schedule a follow-up appointment. ?

## 2022-02-16 NOTE — Assessment & Plan Note (Signed)
She has a history of anxiety and depression along with OCD.  She was seeing a therapist in the past.  She has also tried sertraline and Lexapro for these conditions which she states made her not feel like herself.  Her PHQ-9 is a 13 and GAD-7 is a 15.  She even with these elevated scores, she states that her symptoms are stable.  She is not interested in therapy or medications right now.  She denies SI/HI.  Follow-up if symptoms worsen or with any concerns. ?

## 2022-02-17 LAB — CBC WITH DIFFERENTIAL/PLATELET
Absolute Monocytes: 731 cells/uL (ref 200–950)
Basophils Absolute: 59 cells/uL (ref 0–200)
Basophils Relative: 0.7 %
Eosinophils Absolute: 202 cells/uL (ref 15–500)
Eosinophils Relative: 2.4 %
HCT: 39.4 % (ref 35.0–45.0)
Hemoglobin: 13.2 g/dL (ref 11.7–15.5)
Lymphs Abs: 2310 cells/uL (ref 850–3900)
MCH: 28.9 pg (ref 27.0–33.0)
MCHC: 33.5 g/dL (ref 32.0–36.0)
MCV: 86.4 fL (ref 80.0–100.0)
MPV: 11.1 fL (ref 7.5–12.5)
Monocytes Relative: 8.7 %
Neutro Abs: 5099 cells/uL (ref 1500–7800)
Neutrophils Relative %: 60.7 %
Platelets: 313 10*3/uL (ref 140–400)
RBC: 4.56 10*6/uL (ref 3.80–5.10)
RDW: 12.6 % (ref 11.0–15.0)
Total Lymphocyte: 27.5 %
WBC: 8.4 10*3/uL (ref 3.8–10.8)

## 2022-02-17 LAB — COMPREHENSIVE METABOLIC PANEL
AG Ratio: 1.2 (calc) (ref 1.0–2.5)
ALT: 21 U/L (ref 6–29)
AST: 26 U/L (ref 10–30)
Albumin: 4.3 g/dL (ref 3.6–5.1)
Alkaline phosphatase (APISO): 87 U/L (ref 31–125)
BUN/Creatinine Ratio: 12 (calc) (ref 6–22)
BUN: 15 mg/dL (ref 7–25)
CO2: 28 mmol/L (ref 20–32)
Calcium: 9.9 mg/dL (ref 8.6–10.2)
Chloride: 102 mmol/L (ref 98–110)
Creat: 1.27 mg/dL — ABNORMAL HIGH (ref 0.50–0.96)
Globulin: 3.5 g/dL (calc) (ref 1.9–3.7)
Glucose, Bld: 86 mg/dL (ref 65–99)
Potassium: 3.7 mmol/L (ref 3.5–5.3)
Sodium: 140 mmol/L (ref 135–146)
Total Bilirubin: 0.3 mg/dL (ref 0.2–1.2)
Total Protein: 7.8 g/dL (ref 6.1–8.1)

## 2022-02-17 LAB — TSH: TSH: 0.65 mIU/L

## 2022-02-19 NOTE — Addendum Note (Signed)
Addended by: Rodman Pickle A on: 02/19/2022 10:14 AM ? ? Modules accepted: Orders ? ?

## 2022-02-20 NOTE — Progress Notes (Signed)
Could not lvm. Comments left in mychart for patient to view. Sw, cma

## 2022-12-24 ENCOUNTER — Telehealth: Payer: Self-pay | Admitting: Physician Assistant

## 2022-12-24 DIAGNOSIS — H1033 Unspecified acute conjunctivitis, bilateral: Secondary | ICD-10-CM

## 2022-12-24 MED ORDER — POLYMYXIN B-TRIMETHOPRIM 10000-0.1 UNIT/ML-% OP SOLN: OPHTHALMIC | 0 refills | Status: DC

## 2022-12-24 NOTE — Progress Notes (Signed)
Virtual Visit Consent   Emily Lowe, you are scheduled for a virtual visit with a [facility] provider today. Just as with appointments in the office, your consent must be obtained to participate. Your consent will be active for this visit and any virtual visit you may have with one of our providers in the next 365 days. If you have a MyChart account, a copy of this consent can be sent to you electronically.  As this is a virtual visit, video technology does not allow for your provider to perform a traditional examination. This may limit your provider's ability to fully assess your condition. If your provider identifies any concerns that need to be evaluated in person or the need to arrange testing (such as labs, EKG, etc.), we will make arrangements to do so. Although advances in technology are sophisticated, we cannot ensure that it will always work on either your end or our end. If the connection with a video visit is poor, the visit may have to be switched to a telephone visit. With either a video or telephone visit, we are not always able to ensure that we have a secure connection.  By engaging in this virtual visit, you consent to the provision of healthcare and authorize for your insurance to be billed (if applicable) for the services provided during this visit. Depending on your insurance coverage, you may receive a charge related to this service.  I need to obtain your verbal consent now. Are you willing to proceed with your visit today? Emily Lowe has provided verbal consent on 12/24/2022 for a virtual visit (video or telephone). Emily Lowe, [place]  Date: 12/24/2022 10:00 AM  Virtual Visit via Video Note   I, Emily Lowe, connected with  KACIE HUXTABLE  ([id number], 1996-02-05) on 12/24/22 at 10:00 AM EST by a video-enabled telemedicine application and verified that I am speaking with the correct person using two identifiers.  Location: Patient: Virtual Visit  Location Patient: Home Provider: Virtual Visit Location Provider: Home Office   I discussed the limitations of evaluation and management by telemedicine and the availability of in person appointments. The patient expressed understanding and agreed to proceed.    History of Present Illness: Emily Lowe is a 27 y.o. who identifies as a female who was assigned female at birth, and is being seen today for 4 days of bilateral eye irritation with drainage and crusting. Drainage now yellow. Denies fever, chills. Is currently on Amoxicillin for strep throat from an UC, started a few days ago. Denies vision change. Does not wear contact lenses.  OTC -- Ibuprofen and Tylenol.     HPI: HPI  Problems:  Patient Active Problem List   Diagnosis Date Noted   Palpitations 02/16/2022   Chest wall pain 02/16/2022   Anxiety and depression 11/18/2018   Contraception management 09/03/2016   OCD (obsessive compulsive disorder) 11/10/2015   Kidney stones     Allergies: No Known Allergies Medications:  Current Outpatient Medications:    amoxicillin (AMOXIL) 500 MG capsule, , Disp: , Rfl:    trimethoprim-polymyxin b (POLYTRIM) ophthalmic solution, Apply 1-2 drops into affected eye QID x 5 days., Disp: 10 mL, Rfl: 0   acetaminophen (TYLENOL) 500 MG tablet, Take 500 mg by mouth every 6 (six) hours as needed for moderate pain. , Disp: , Rfl:    ibuprofen (ADVIL) 800 MG tablet, Take 1 tablet (800 mg total) by mouth every 8 (eight) hours., Disp: 30 tablet, Rfl: 0  Observations/Objective: Patient is well-developed, well-nourished in no acute distress.  Resting comfortably at home.  Head is normocephalic, atraumatic.  No labored breathing. Speech is clear and coherent with logical content.  Patient is alert and oriented at baseline.  Bilateral conjunctival injection noted with crusting on eyelashes. Pupils are equal and round.  Assessment and Plan: 1. Acute bacterial conjunctivitis of both eyes -  trimethoprim-polymyxin b (POLYTRIM) ophthalmic solution; Apply 1-2 drops into affected eye QID x 5 days.  Dispense: 10 mL; Refill: 0  Supportive measures and OTC medications reviewed. Polytrim OP per orders. Follow-up in person if not resolving.   Follow Up Instructions: I discussed the assessment and treatment plan with the patient. The patient was provided an opportunity to ask questions and all were answered. The patient agreed with the plan and demonstrated an understanding of the instructions.  A copy of instructions were sent to the patient via MyChart unless otherwise noted below.   The patient was advised to call back or seek an in-person evaluation if the symptoms worsen or if the condition fails to improve as anticipated.  Time:  I spent 10 minutes with the patient via telehealth technology discussing the above problems/concerns.    Emily Rio, PA-C

## 2022-12-24 NOTE — Patient Instructions (Signed)
  Emily Lowe, thank you for joining [name], PA-C for today's virtual visit.  While this provider is not your primary care provider (PCP), if your PCP is located in our provider database this encounter information will be shared with them immediately following your visit.   [facility] account gives you access to today's visit and all your visits, tests, and labs performed at [facility] " click here if you don't have a [facility] account or go to [email or web address]  Consent: (Patient) Emily Lowe provided verbal consent for this virtual visit at the beginning of the encounter.  Current Medications:  Current Outpatient Medications:    acetaminophen (TYLENOL) 500 MG tablet, Take 500 mg by mouth every 6 (six) hours as needed for moderate pain. , Disp: , Rfl:    ibuprofen (ADVIL) 800 MG tablet, Take 1 tablet (800 mg total) by mouth every 8 (eight) hours., Disp: 30 tablet, Rfl: 0   Medications ordered in this encounter:  No orders of the defined types were placed in this encounter.    *If you need refills on other medications prior to your next appointment, please contact your pharmacy*  Follow-Up: Call back or seek an in-person evaluation if the symptoms worsen or if the condition fails to improve as anticipated.  [facility] [phone]  Other Instructions Please keep hands washed and avoid rubbing the eyes. Start warm compresses as discussed.  Used the antibiotic drops as directed. If symptoms are not resolving, or you note any new or worsening symptoms despite treatment, please seek an in-person evaluation.   If you have been instructed to have an in-person evaluation today at a local Urgent Care facility, please use the link below. It will take you to a list of all of our available [facility], including address, phone number and hours of operation. Please do not delay care.  [facility]  If you or a family member do not have a primary care provider, use the link below to schedule a visit and establish care. When you choose a [facility] primary care physician or advanced practice provider, you gain a long-term partner in health. Find a Primary Care Provider  Learn more about [facility]'s in-office and virtual care options: [facility]

## 2023-01-01 ENCOUNTER — Ambulatory Visit: Payer: 59 | Admitting: Family Medicine

## 2023-01-01 ENCOUNTER — Encounter: Payer: Self-pay | Admitting: Family Medicine

## 2023-01-01 VITALS — BP 106/78 | HR 90 | Temp 98.3°F | Ht 60.0 in | Wt 146.8 lb

## 2023-01-01 DIAGNOSIS — M542 Cervicalgia: Secondary | ICD-10-CM

## 2023-01-01 MED ORDER — NAPROXEN 500 MG PO TABS: 500.0000 mg | ORAL_TABLET | Freq: Two times a day (BID) | ORAL | 0 refills | Status: DC

## 2023-01-01 NOTE — Progress Notes (Signed)
[facility] [address] [place] [address] Dept: [phone] Dept Fax: [phone]  Office Visit  Subjective:    Patient ID: Emily Lowe, female    DOB: 09-19-1996, 27 y.o..   MRN: [id number]  Chief Complaint  Patient presents with   Acute Visit    Left shoulder pain for 4 days, anxious feeling, pain in chest    History of Present Illness:  Patient is in today complaining of a 4-day history of left shoulder pain. She is not sure of any injury that may have set this off. she notes this runs form her neck and down her left arm. She had some brief dizziness associated with this. She has also noted some mild left chest pressure/pain. She states she had a pharyngitis 1 1/2 weeks ago. Although she tested neg. on a rapid strep test, she was treated with a course of amoxicillin. Her symptoms improved, but she now has some mild right-sided sore throat.  Emily Lowe has a history of anxiety with depression and OCD. She stopped medication for this last year, having felt the medicine did not help her. She is not interested in medication for this at present.  Past Medical History: Patient Active Problem List   Diagnosis Date Noted   Palpitations 02/16/2022   Chest wall pain 02/16/2022   Anxiety and depression 11/18/2018   Contraception management 09/03/2016   OCD (obsessive compulsive disorder) 11/10/2015   Kidney stones    Past Surgical History:  Procedure Laterality Date   CESAREAN SECTION N/A 07/17/2020   Procedure: CESAREAN SECTION;  Surgeon: Janyth Contes, MD;  Location: [facility] LD ORS;  Service: Obstetrics;  Laterality: N/A;   CYSTOSCOPY W/ URETERAL STENT PLACEMENT Right 03/18/2020   Procedure: CYSTOSCOPY WITH STENT REPLACEMENT;  Surgeon: Ceasar Mons, MD;  Location: [facility] ORS;  Service: Urology;  Laterality: Right;   CYSTOSCOPY/URETEROSCOPY/HOLMIUM LASER/STENT PLACEMENT Left 01/26/2020   Procedure:  CYSTOSCOPY/URETEROSCOPY/HOLMIUM LASER/STENT PLACEMENT/REMOVAL PERCUTANEOUS NEPHROSTOMY TUBE LEFT;  Surgeon: Robley Fries, MD;  Location: [facility] ORS;  Service: Urology;  Laterality: Left;  90 MINS   CYSTOSCOPY/URETEROSCOPY/HOLMIUM LASER/STENT PLACEMENT Bilateral 04/05/2020   Procedure: CYSTOSCOPY/URETEROSCOPY/HOLMIUM LASER/STENT PLACEMENT;  Surgeon: Robley Fries, MD;  Location: [facility] ORS;  Service: Urology;  Laterality: Bilateral;  90 MINS   IR NEPHROSTOMY PLACEMENT LEFT  12/15/2019   WISDOM TOOTH EXTRACTION     Family History  Problem Relation Age of Onset   Hypertension Mother    Gout Father    Diabetes Father    Outpatient Medications Prior to Visit  Medication Sig Dispense Refill   acetaminophen (TYLENOL) 500 MG tablet Take 500 mg by mouth every 6 (six) hours as needed for moderate pain.      ibuprofen (ADVIL) 800 MG tablet Take 1 tablet (800 mg total) by mouth every 8 (eight) hours. 30 tablet 0   amoxicillin (AMOXIL) 500 MG capsule      trimethoprim-polymyxin b (POLYTRIM) ophthalmic solution Apply 1-2 drops into affected eye QID x 5 days. 10 mL 0   No facility-administered medications prior to visit.   No Known Allergies    Objective:   Today's Vitals   01/01/23 1555  BP: 106/78  Pulse: 90  Temp: 98.3 F (36.8 C)  SpO2: 99%  Weight: 146 lb 12.8 oz (66.6 kg)  Height: 5' (1.524 m)   Body mass index is 28.67 kg/m.   General: Well developed, well nourished. No acute distress. HEENT: Normocephalic, non-traumatic. Mucous membranes moist. Oropharynx clear.  Neck:  Supple. No lymphadenopathy. No tenderness to palpation over the posterior neck. Lungs: Clear to auscultation bilaterally. No wheezing, rales or rhonchi. CV: RRR without murmurs or rubs. Pulses 2+ bilaterally. Extremities: Full ROM. No joint swelling or tenderness. No rotator cuff tenderness or weakness. Strength normal.   +/- mild numbness over the dorsum of the left hand. Psych: Alert and oriented. Normal mood and  affect.  Health Maintenance Due  Topic Date Due   COVID-19 Vaccine (1) Never done   Hepatitis C Screening  Never done   PAP-Cervical Cytology Screening  09/24/2020   PAP SMEAR-Modifier  09/24/2020   INFLUENZA VACCINE  Never done     Assessment & Plan:   Problem List Items Addressed This Visit   None Visit Diagnoses     Neck pain, acute    -  Primary   I suspect this is mostly a muscle strain of the neck, but there could be some mild sensory radiculopathy. I will prescribe an NSAID, heat and stretches.   Relevant Medications   naproxen (NAPROSYN) 500 MG tablet       Return in about 1 week (around 01/08/2023), or if symptoms worsen or fail to improve.   Haydee Salter, MD

## 2023-01-05 ENCOUNTER — Other Ambulatory Visit: Payer: Self-pay | Admitting: Family Medicine

## 2023-01-05 DIAGNOSIS — M542 Cervicalgia: Secondary | ICD-10-CM

## 2023-03-13 ENCOUNTER — Telehealth: Payer: Self-pay | Admitting: Nurse Practitioner

## 2023-03-13 ENCOUNTER — Ambulatory Visit: Payer: 59 | Admitting: Nurse Practitioner

## 2023-03-13 NOTE — Telephone Encounter (Signed)
Noted  

## 2023-03-13 NOTE — Telephone Encounter (Signed)
Emily Lowe [phone] Service date 4/17  Pt had to pick up her toddler from daycare because she is sick.

## 2023-03-13 NOTE — Telephone Encounter (Signed)
same day cancel, child sick and has to pick up at daycare, fee waived, letter sent via [facility]

## 2023-08-12 IMAGING — DX DG HAND COMPLETE 3+V*L*
1 series · 3 of 3 positions shown · non-contrast
Comparison: None.

CLINICAL DATA: Foreign body evaluation.

EXAM:
LEFT HAND - COMPLETE 3+ VIEW

[Series 1: hand · 0.14mm/px · 3 of 3 slices shown]
[im 1/3]
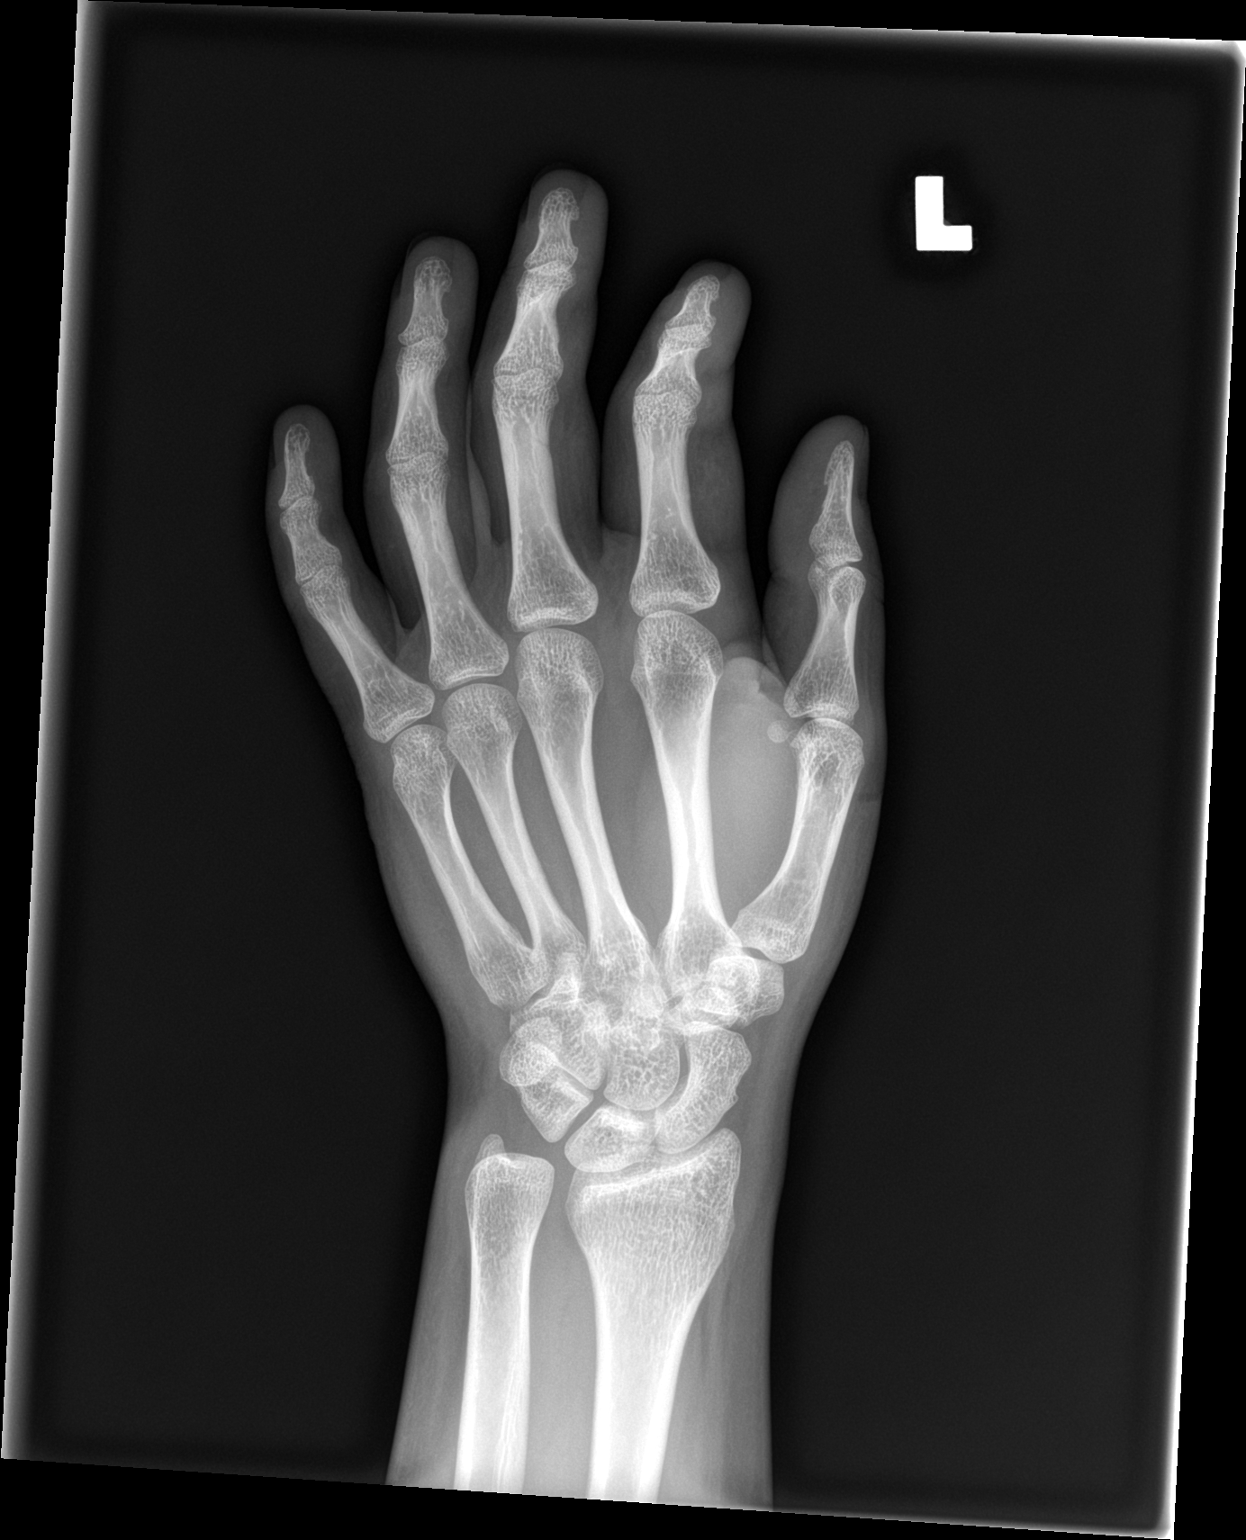
[im 2/3]
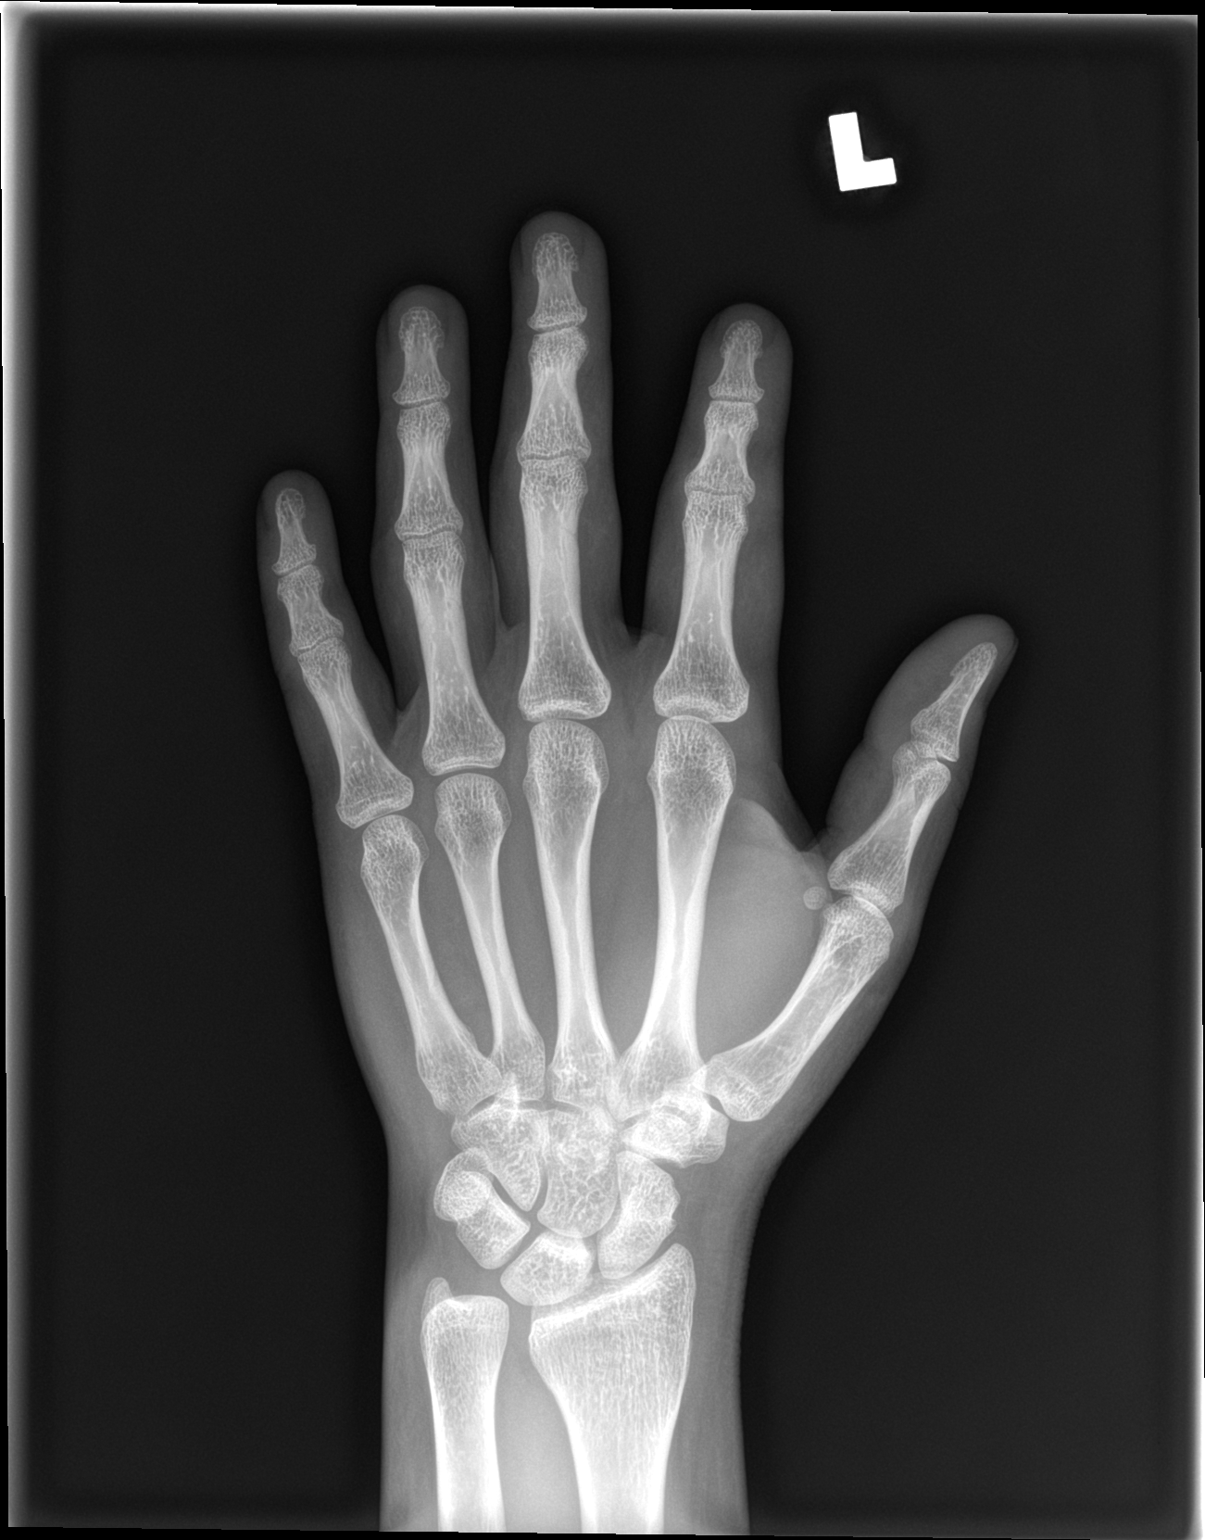
[im 3/3]
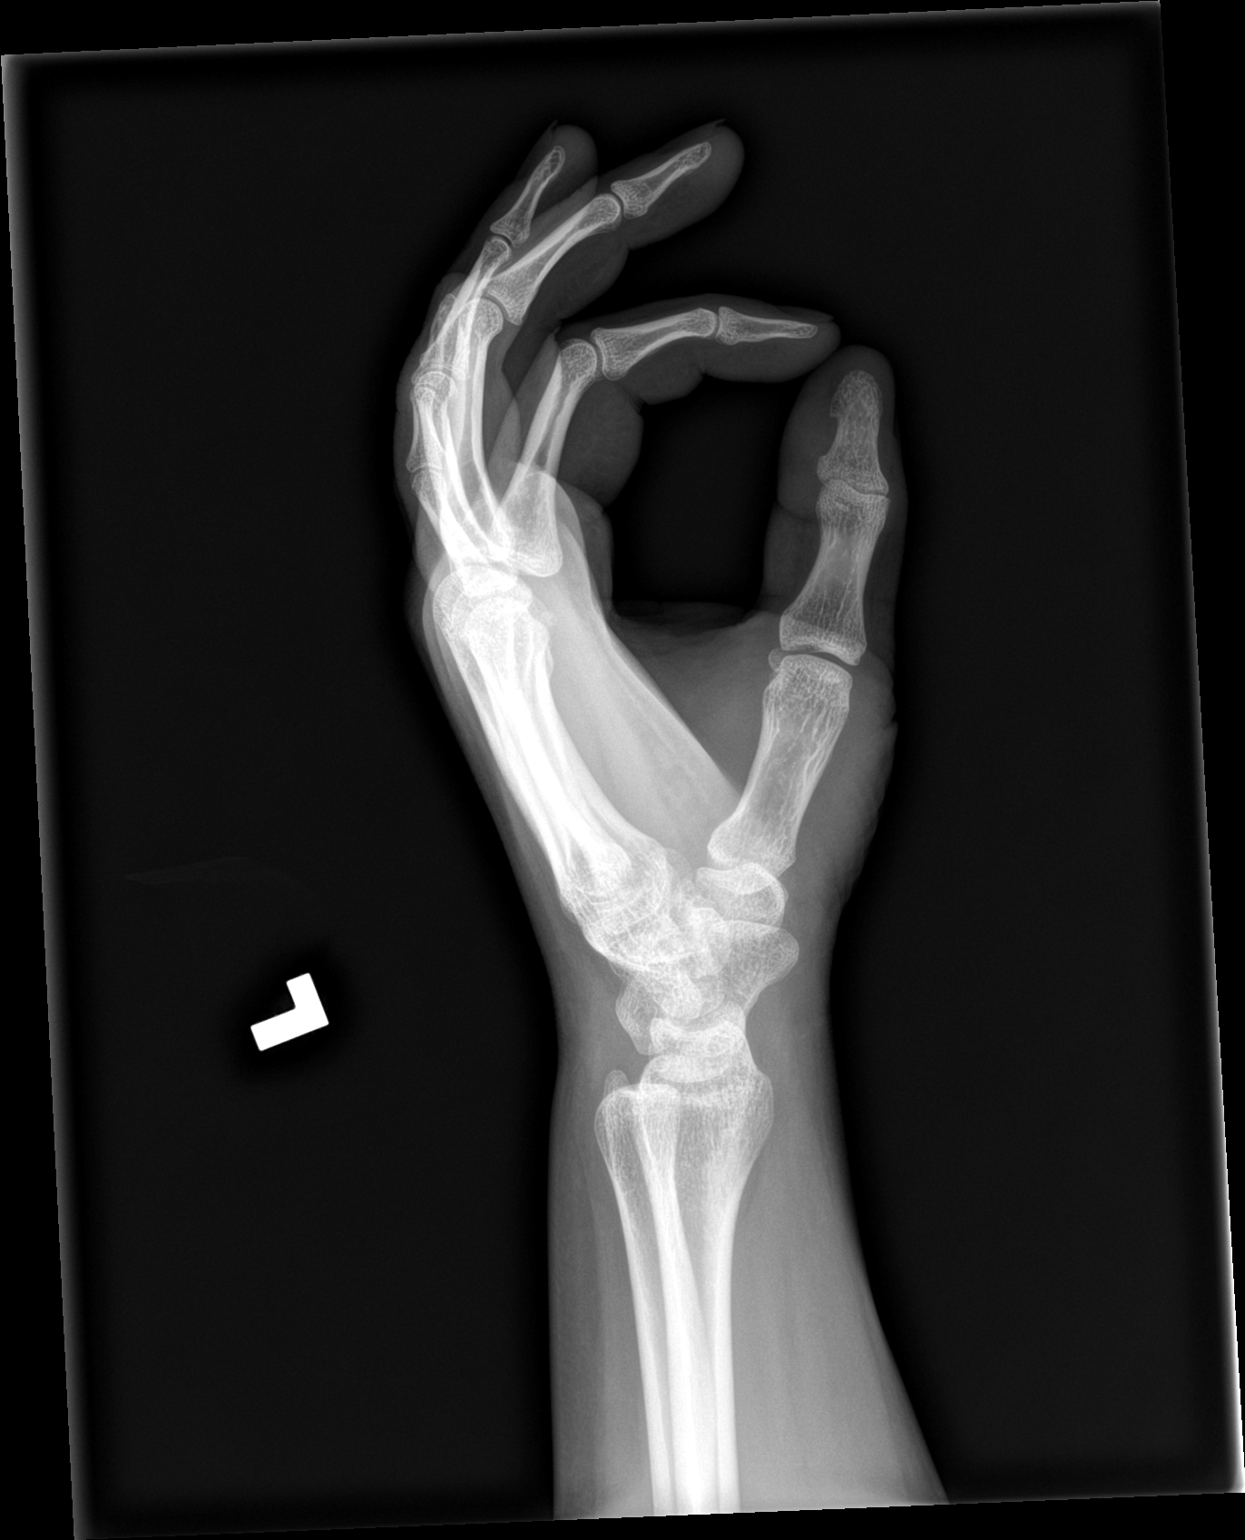

[3 of 3 positions shown; findings below may reference images not displayed]

FINDINGS: There is no evidence of fracture or dislocation. There is no
evidence of arthropathy or other focal bone abnormality. Soft
tissues are unremarkable. No evidence for foreign body.
IMPRESSION: Negative.

## 2023-12-12 ENCOUNTER — Telehealth: Payer: 59 | Admitting: Physician Assistant

## 2023-12-12 ENCOUNTER — Ambulatory Visit: Payer: Self-pay | Admitting: Nurse Practitioner

## 2023-12-12 DIAGNOSIS — G479 Sleep disorder, unspecified: Secondary | ICD-10-CM | POA: Diagnosis not present

## 2023-12-12 DIAGNOSIS — K219 Gastro-esophageal reflux disease without esophagitis: Secondary | ICD-10-CM

## 2023-12-12 DIAGNOSIS — F419 Anxiety disorder, unspecified: Secondary | ICD-10-CM

## 2023-12-12 MED ORDER — HYDROXYZINE HCL 10 MG PO TABS: 10.0000 mg | ORAL_TABLET | Freq: Three times a day (TID) | ORAL | 0 refills | Status: AC | PRN

## 2023-12-12 MED ORDER — PANTOPRAZOLE SODIUM 40 MG PO TBEC: 40.0000 mg | DELAYED_RELEASE_TABLET | Freq: Every day | ORAL | 0 refills | Status: AC

## 2023-12-12 NOTE — Patient Instructions (Signed)
  Emily Lowe, thank you for joining Margaretann Loveless, PA-C for today's virtual visit.  While this provider is not your primary care provider (PCP), if your PCP is located in our provider database this encounter information will be shared with them immediately following your visit.   A [facility] MyChart account gives you access to today's visit and all your visits, tests, and labs performed at [facility] " click here if you don't have a [facility] MyChart account or go to [email or web address]  Consent: (Patient) Emily Lowe provided verbal consent for this virtual visit at the beginning of the encounter.  Current Medications:  Current Outpatient Medications:    hydrOXYzine (ATARAX) 10 MG tablet, Take 1 tablet (10 mg total) by mouth 3 (three) times daily as needed., Disp: 30 tablet, Rfl: 0   pantoprazole (PROTONIX) 40 MG tablet, Take 1 tablet (40 mg total) by mouth daily., Disp: 30 tablet, Rfl: 0   acetaminophen (TYLENOL) 500 MG tablet, Take 500 mg by mouth every 6 (six) hours as needed for moderate pain. , Disp: , Rfl:    naproxen (NAPROSYN) 500 MG tablet, TAKE 1 TABLET(500 MG) BY MOUTH TWICE DAILY WITH A MEAL, Disp: 30 tablet, Rfl: 0   Medications ordered in this encounter:  Meds ordered this encounter  Medications   hydrOXYzine (ATARAX) 10 MG tablet    Sig: Take 1 tablet (10 mg total) by mouth 3 (three) times daily as needed.    Dispense:  30 tablet    Refill:  0    Supervising Provider:   Merrilee Jansky [[id number]]   pantoprazole (PROTONIX) 40 MG tablet    Sig: Take 1 tablet (40 mg total) by mouth daily.    Dispense:  30 tablet    Refill:  0    Supervising Provider:   Merrilee Jansky [[id number]]     *If you need refills on other medications prior to your next appointment, please contact your pharmacy*  Follow-Up: Call back or seek an in-person evaluation if the symptoms worsen or if the condition fails to improve as anticipated.  [facility]  Virtual Care [phone]  Other Instructions Gastroesophageal Reflux Disease (GERD) In this video, you'll learn about GERD or gastroesophageal reflux disease, including its symptoms, treatment, and when to seek medical care. To view the content, go to this web address: [email or web address]  This video will expire on: 05/19/2025. If you need access to this video following this date, please reach out to the healthcare provider who assigned it to you. This information is not intended to replace advice given to you by your health care provider. Make sure you discuss any questions you have with your health care provider. Elsevier Patient Education  2024 Elsevier Inc.    If you have been instructed to have an in-person evaluation today at a local Urgent Care facility, please use the link below. It will take you to a list of all of our available [facility]gent [facility], including address, phone number and hours of operation. Please do not delay care.  [facility]  If you or a family member do not have a primary care provider, use the link below to schedule a visit and establish care. When you choose a [facility] primary care physician or advanced practice provider, you gain a long-term partner in health. Find a Primary Care Provider  Learn more about [facility]'s in-office and virtual care options: [facility] - Get Care Now

## 2023-12-12 NOTE — Progress Notes (Signed)
Virtual Visit Consent   Emily Lowe, you are scheduled for a virtual visit with a [facility] provider today. Just as with appointments in the office, your consent must be obtained to participate. Your consent will be active for this visit and any virtual visit you may have with one of our providers in the next 365 days. If you have a MyChart account, a copy of this consent can be sent to you electronically.  As this is a virtual visit, video technology does not allow for your provider to perform a traditional examination. This may limit your provider's ability to fully assess your condition. If your provider identifies any concerns that need to be evaluated in person or the need to arrange testing (such as labs, EKG, etc.), we will make arrangements to do so. Although advances in technology are sophisticated, we cannot ensure that it will always work on either your end or our end. If the connection with a video visit is poor, the visit may have to be switched to a telephone visit. With either a video or telephone visit, we are not always able to ensure that we have a secure connection.  By engaging in this virtual visit, you consent to the provision of healthcare and authorize for your insurance to be billed (if applicable) for the services provided during this visit. Depending on your insurance coverage, you may receive a charge related to this service.  I need to obtain your verbal consent now. Are you willing to proceed with your visit today? Emily Lowe has provided verbal consent on 12/12/2023 for a virtual visit (video or telephone). Margaretann Loveless, PA-C  Date: 12/12/2023 6:19 PM  Virtual Visit via Video Note   I, Margaretann Loveless, connected with  Emily Lowe  ([id number], 09-04-1996) on 12/12/23 at  6:00 PM EST by a video-enabled telemedicine application and verified that I am speaking with the correct person using two identifiers.  Location: Patient: Virtual Visit  Location Patient: Home Provider: Virtual Visit Location Provider: Home Office   I discussed the limitations of evaluation and management by telemedicine and the availability of in person appointments. The patient expressed understanding and agreed to proceed.    History of Present Illness: MONISHA DELEO is a 28 y.o. who identifies as a female who was assigned female at birth, and is being seen today for anxiety, chest tightness.    Reports chest feels heavy and tight. Feels it is related to sleep apnea. Over the last few months has been waking up with gasping for air. Has been happening more frequently. Feels she may have sleep apnea.  When these episodes happen she reports she is afraid to fall back asleep.   Getting about 5 hours per night.   PMH: Anxiety, depression, OCD  No family history of sleep apnea  Denies any chest pain or swelling. Has had random sharp pains in her legs. Unsure of cause.   Has never had to take anything as needed for anxiety.  Has had Lexapro and Sertraline in the past   Does also mention she has had severe reflux symptoms. She feels she has been dealing with this since her pregnancy in 2021. She has been using Pepcid, not working. Has also tried Pepto and Tums.    Problems:  Patient Active Problem List   Diagnosis Date Noted   Palpitations 02/16/2022   Chest wall pain 02/16/2022   Anxiety and depression 11/18/2018   OCD (obsessive compulsive disorder) 11/10/2015  Kidney stones     Allergies: No Known Allergies Medications:  Current Outpatient Medications:    hydrOXYzine (ATARAX) 10 MG tablet, Take 1 tablet (10 mg total) by mouth 3 (three) times daily as needed., Disp: 30 tablet, Rfl: 0   pantoprazole (PROTONIX) 40 MG tablet, Take 1 tablet (40 mg total) by mouth daily., Disp: 30 tablet, Rfl: 0   acetaminophen (TYLENOL) 500 MG tablet, Take 500 mg by mouth every 6 (six) hours as needed for moderate pain. , Disp: , Rfl:    naproxen (NAPROSYN) 500  MG tablet, TAKE 1 TABLET(500 MG) BY MOUTH TWICE DAILY WITH A MEAL, Disp: 30 tablet, Rfl: 0  Observations/Objective: Patient is well-developed, well-nourished in no acute distress.  Resting comfortably at home.  Head is normocephalic, atraumatic.  No labored breathing.  Speech is clear and coherent with logical content.  Patient is alert and oriented at baseline.    Assessment and Plan: 1. Acute anxiety (Primary) - hydrOXYzine (ATARAX) 10 MG tablet; Take 1 tablet (10 mg total) by mouth 3 (three) times daily as needed.  Dispense: 30 tablet; Refill: 0  2. Difficulty sleeping - hydrOXYzine (ATARAX) 10 MG tablet; Take 1 tablet (10 mg total) by mouth 3 (three) times daily as needed.  Dispense: 30 tablet; Refill: 0  3. Gastroesophageal reflux disease without esophagitis - pantoprazole (PROTONIX) 40 MG tablet; Take 1 tablet (40 mg total) by mouth daily.  Dispense: 30 tablet; Refill: 0  - Will start Hydroxyzine for as needed anxiety and sleep - Add Pantoprazole for GERD - Long discussion that a lot of these symptoms may be from uncontrolled reflux - Follow up with PCP for sleep study if warranted or desired - Coping mechanisms and grounding discussed - Seek in person evaluation if symptoms worsen or fail to improve  Follow Up Instructions: I discussed the assessment and treatment plan with the patient. The patient was provided an opportunity to ask questions and all were answered. The patient agreed with the plan and demonstrated an understanding of the instructions.  A copy of instructions were sent to the patient via MyChart unless otherwise noted below.    The patient was advised to call back or seek an in-person evaluation if the symptoms worsen or if the condition fails to improve as anticipated.    Margaretann Loveless, PA-C

## 2023-12-12 NOTE — Telephone Encounter (Signed)
Chief Complaint: Chest tightness, possible anxiety Symptoms: chest tightness, feeling funny Frequency: intermittent Pertinent Negatives: Patient denies dizziness, chest pain Disposition: [] ED /[x] Urgent Care (no appt availability in office) / [x] Appointment(In office/virtual)/ []  [facility]/ [] Home Care/ [] Refused Recommended Disposition /[] [facility]/ []  Follow-up with PCP Additional Notes: Patient called in stating she has been experiencing chest tightness and trouble sleeping. Patient states she does not feel dizzy or have chest pain, but feels "weird". Patient states she also was experiencing heart palpitations while on the call. Advised patient to feel her pulse manually on her wrist to see if her heart rate felt regular rhythm and no skipped beats, patient states heartrate feels regular. Patient denies any previous diagnosis for anxiety but states she has been diagnosed with OCD. Patient also states she believes she may have sleep apnea because when she is sleeping, she wakes up gasping for air. Virtual UC appointment created for patient for this evening for acute evaluation and potential follow up.   Copied from CRM [id number]. Topic: Clinical - Red Word Triage >> Dec 12, 2023  3:19 PM Desma Mcgregor wrote: Red Word that prompted transfer to Nurse Triage: Chest heaviness/tightness since last night. Poss due to anxiety and lack of sleep. Wakes up panicked Reason for Disposition  MODERATE anxiety (e.g., persistent or frequent anxiety symptoms; interferes with sleep, school, or work)  Answer Assessment - Initial Assessment Questions 1. CONCERN: "Did anything happen that prompted you to call today?"      Feeling anxious, chest tightness,  2. ANXIETY SYMPTOMS: "Can you describe how you (your loved one; patient) have been feeling?" (e.g., tense, restless, panicky, anxious, keyed up, overwhelmed, sense of impending doom).      Chest tightness, tired (waking up gasping for air),  can't take a deep breath that's effective 3. ONSET: "How long have you been feeling this way?" (e.g., hours, days, weeks)     Intermittent for a few days 4. SEVERITY: "How would you rate the level of anxiety?" (e.g., 0 - 10; or mild, moderate, severe).     8 6. HISTORY: "Have you felt this way before?" "Have you ever been diagnosed with an anxiety problem in the past?" (e.g., generalized anxiety disorder, panic attacks, PTSD). If Yes, ask: "How was this problem treated?" (e.g., medicines, counseling, etc.)     I am not diagnosed with anxiety but I have been diagnosed with OCD 8. TREATMENT:  "What has been done so far to treat this anxiety?" (e.g., medicines, relaxation strategies). "What has helped?"     Nothing 9. TREATMENT - THERAPIST: "Do you have a counselor or therapist? Name?"     No 10. POTENTIAL TRIGGERS: "Do you drink caffeinated beverages (e.g., coffee, colas, teas), and how much daily?" "Do you drink alcohol or use any drugs?" "Have you started any new medicines recently?"       Not sleeping well, believes to have sleep apnea (been waking up gasping for air) 12. OTHER SYMPTOMS: "Do you have any other symptoms?" (e.g., feeling depressed, trouble concentrating, trouble sleeping, trouble breathing, palpitations or fast heartbeat, chest pain, sweating, nausea, or diarrhea)       Trouble sleeping 13. PREGNANCY: "Is there any chance you are pregnant?" "When was your last menstrual period?"       Possibly  Protocols used: Anxiety and Panic Attack-A-AH

## 2023-12-13 NOTE — Telephone Encounter (Signed)
Noted  

## 2024-03-23 ENCOUNTER — Encounter

## 2024-06-01 ENCOUNTER — Encounter: Payer: Self-pay | Admitting: Nurse Practitioner

## 2024-07-07 ENCOUNTER — Encounter: Payer: Self-pay | Admitting: Nurse Practitioner
